# Patient Record
Sex: Male | Born: 1970 | Race: Black or African American | Hispanic: No | Marital: Married | Smoking: Never smoker
Health system: Southern US, Community
[De-identification: ages and names within clinical notes are randomized; demographics above are authoritative.]

## PROBLEM LIST (undated history)

## (undated) DIAGNOSIS — I10 Essential (primary) hypertension: Secondary | ICD-10-CM

## (undated) DIAGNOSIS — T7840XA Allergy, unspecified, initial encounter: Secondary | ICD-10-CM

## (undated) DIAGNOSIS — N189 Chronic kidney disease, unspecified: Secondary | ICD-10-CM

## (undated) DIAGNOSIS — F32A Depression, unspecified: Secondary | ICD-10-CM

## (undated) DIAGNOSIS — F419 Anxiety disorder, unspecified: Secondary | ICD-10-CM

## (undated) DIAGNOSIS — F329 Major depressive disorder, single episode, unspecified: Secondary | ICD-10-CM

## (undated) DIAGNOSIS — N181 Chronic kidney disease, stage 1: Secondary | ICD-10-CM

## (undated) HISTORY — DX: Allergy, unspecified, initial encounter: T78.40XA

## (undated) HISTORY — DX: Chronic kidney disease, unspecified: N18.9

## (undated) HISTORY — DX: Major depressive disorder, single episode, unspecified: F32.9

## (undated) HISTORY — DX: Essential (primary) hypertension: I10

## (undated) HISTORY — DX: Chronic kidney disease, stage 1: N18.1

## (undated) HISTORY — DX: Anxiety disorder, unspecified: F41.9

## (undated) HISTORY — DX: Depression, unspecified: F32.A

---

## 1997-10-28 HISTORY — PX: NASAL SEPTUM SURGERY: SHX37

## 1998-09-04 ENCOUNTER — Other Ambulatory Visit: Admission: RE | Admit: 1998-09-04 | Discharge: 1998-09-04 | Payer: Self-pay | Admitting: Otolaryngology

## 1998-12-31 ENCOUNTER — Encounter: Payer: Self-pay | Admitting: Family Medicine

## 1998-12-31 ENCOUNTER — Ambulatory Visit (HOSPITAL_COMMUNITY): Admission: RE | Admit: 1998-12-31 | Discharge: 1998-12-31 | Payer: Self-pay | Admitting: Family Medicine

## 2009-05-13 ENCOUNTER — Emergency Department (HOSPITAL_COMMUNITY): Admission: EM | Admit: 2009-05-13 | Discharge: 2009-05-13 | Payer: Self-pay | Admitting: Emergency Medicine

## 2011-02-03 LAB — BASIC METABOLIC PANEL
BUN: 12 mg/dL (ref 6–23)
CO2: 28 mEq/L (ref 19–32)
Calcium: 9.7 mg/dL (ref 8.4–10.5)
Chloride: 102 mEq/L (ref 96–112)
Creatinine, Ser: 1.41 mg/dL (ref 0.4–1.5)
GFR calc Af Amer: >60 mL/min (ref >60)
GFR calc non Af Amer: 56 mL/min — ABNORMAL LOW (ref >60)
Glucose, Bld: 91 mg/dL (ref 70–99)
Potassium: 4.2 mEq/L (ref 3.5–5.1)
Sodium: 137 mEq/L (ref 135–145)

## 2011-02-03 LAB — CBC
HCT: 47.2 % (ref 39.0–52.0)
Hemoglobin: 16 g/dL (ref 13.0–17.0)
MCHC: 33.9 g/dL (ref 30.0–36.0)
MCV: 86.7 fL (ref 78.0–100.0)
Platelets: 394 10*3/uL (ref 150–400)
RBC: 5.44 MIL/uL (ref 4.22–5.81)
RDW: 12.2 % (ref 11.5–15.5)
WBC: 5.4 10*3/uL (ref 4.0–10.5)

## 2011-02-03 LAB — DIFFERENTIAL
Basophils Absolute: 0 10*3/uL (ref 0.0–0.1)
Basophils Relative: 1 % (ref 0–1)
Eosinophils Absolute: 0.1 10*3/uL (ref 0.0–0.7)
Eosinophils Relative: 1 % (ref 0–5)
Lymphocytes Relative: 35 % (ref 12–46)
Lymphs Abs: 1.9 10*3/uL (ref 0.7–4.0)
Monocytes Absolute: 0.4 10*3/uL (ref 0.1–1.0)
Monocytes Relative: 8 % (ref 3–12)
Neutro Abs: 3 10*3/uL (ref 1.7–7.7)
Neutrophils Relative %: 56 % (ref 43–77)

## 2011-02-03 LAB — HEMOCCULT GUIAC POC 1CARD (OFFICE): Fecal Occult Bld: NEGATIVE

## 2013-09-30 ENCOUNTER — Encounter: Payer: Self-pay | Admitting: Physician Assistant

## 2013-09-30 ENCOUNTER — Ambulatory Visit (INDEPENDENT_AMBULATORY_CARE_PROVIDER_SITE_OTHER): Payer: BC Managed Care – PPO | Admitting: Physician Assistant

## 2013-09-30 VITALS — BP 142/86 | HR 100 | Temp 98.8°F | Resp 20 | Ht 66.5 in | Wt 194.0 lb

## 2013-09-30 DIAGNOSIS — J988 Other specified respiratory disorders: Secondary | ICD-10-CM

## 2013-09-30 DIAGNOSIS — A499 Bacterial infection, unspecified: Secondary | ICD-10-CM

## 2013-09-30 MED ORDER — AZITHROMYCIN 250 MG PO TABS: ORAL_TABLET | ORAL | Status: DC

## 2013-09-30 NOTE — Progress Notes (Signed)
    Patient ID: SHANARD TRETO MRN: [id number], DOB: 11-29-1970, 42 y.o. Date of Encounter: 09/30/2013, 10:54 AM    Chief Complaint:  Chief Complaint  Patient presents with  . c/o fever up/down, bad cough/congestion, Ha's     HPI: 42 y.o. year old AA male here secondary to above symptoms. Since this started 5 days ago. At that time he felt tired and also developed a cough. Since then he has had worsening cough. He also feels a lot of drainage and phlegm in his throat. He is getting nothing from his nose. No significant sore throat and no ear ache. He is having some fever especially at night up to around 101.     Home Meds: See attached medication section for any medications that were entered at today's visit. The computer does not put those onto this list.The following list is a list of meds entered prior to today's visit.   No current outpatient prescriptions on file prior to visit.   No current facility-administered medications on file prior to visit.    Allergies: No Known Allergies    Review of Systems: See HPI for pertinent ROS. All other ROS negative.    Physical Exam: Blood pressure 142/86, pulse 100, temperature 98.8 F (37.1 C), temperature source Oral, resp. rate 20, height 5' 6.5" (1.689 m), weight 194 lb (87.998 kg)., Body mass index is 30.85 kg/(m^2). General:  WNWD AAM. Appears in no acute distress. HEENT: Normocephalic, atraumatic, eyes without discharge, sclera non-icteric, nares are without discharge. Bilateral auditory canals clear, TM's are without perforation, pearly grey and translucent with reflective cone of light bilaterally. Oral cavityt, posterior pharynx without exudate, erythema, peritonsillar abscess.  Neck: Supple. No thyromegaly. No lymphadenopathy. Lungs: Clear bilaterally to auscultation without wheezes, rales, or rhonchi. Breathing is unlabored. Heart: Regular rhythm. No murmurs, rubs, or gallops. Msk:  Strength and tone normal for  age. Extremities/Skin: Warm and dry. No clubbing or cyanosis. No edema. No rashes or suspicious lesions. Neuro: Alert and oriented X 3. Moves all extremities spontaneously. Gait is normal. CNII-XII grossly in tact. Psych:  Responds to questions appropriately with a normal affect.     ASSESSMENT AND PLAN:  42 y.o. year old male with  1. Bacterial respiratory infection - azithromycin (ZITHROMAX) 250 MG tablet; Day 1: Take 2 daily.  Days 2-5: Take 1 daily  Dispense: 6 tablet; Refill: 0  use Mucinex DM as expectorant. Patient reports that he has been out of work all week. He is not allowed to go to work with a fever. Give note for out of work dated 09/27/2013 through 10/01/2013. F/U if this not resolve with completion of antibiotics. Signed, [address] [place], [place], [facility] 09/30/2013 10:54 AM

## 2013-10-03 ENCOUNTER — Telehealth: Payer: Self-pay | Admitting: Family Medicine

## 2013-10-03 NOTE — Telephone Encounter (Signed)
Pt wife called cough and congestion are worsening has a ratteling in his chest and feels very fatigued, Fever has resolved, he is taking zpak and mucinex Advised to come in for recheck , wife can bring him around 10:30 am

## 2013-10-04 ENCOUNTER — Encounter: Payer: Self-pay | Admitting: Family Medicine

## 2013-10-04 ENCOUNTER — Ambulatory Visit (INDEPENDENT_AMBULATORY_CARE_PROVIDER_SITE_OTHER): Payer: BC Managed Care – PPO | Admitting: Family Medicine

## 2013-10-04 ENCOUNTER — Ambulatory Visit
Admission: RE | Admit: 2013-10-04 | Discharge: 2013-10-04 | Disposition: A | Discharge status: Home / Self Care | Payer: BC Managed Care – PPO | Source: Ambulatory Visit | Attending: Family Medicine | Admitting: Family Medicine

## 2013-10-04 VITALS — BP 120/80 | HR 98 | Temp 98.3°F | Resp 20 | Ht 67.0 in | Wt 195.0 lb

## 2013-10-04 DIAGNOSIS — J988 Other specified respiratory disorders: Secondary | ICD-10-CM

## 2013-10-04 DIAGNOSIS — N289 Disorder of kidney and ureter, unspecified: Secondary | ICD-10-CM

## 2013-10-04 DIAGNOSIS — J209 Acute bronchitis, unspecified: Secondary | ICD-10-CM

## 2013-10-04 LAB — CBC WITH DIFFERENTIAL/PLATELET
HCT: 45.1 % (ref 39.0–52.0)
Lymphocytes Relative: 27 % (ref 12–46)
Lymphs Abs: 2.2 10*3/uL (ref 0.7–4.0)
MCHC: 33.3 g/dL (ref 30.0–36.0)
MCV: 87.3 fL (ref 78.0–100.0)
Monocytes Relative: 3 % (ref 3–12)
Neutro Abs: 5.6 10*3/uL (ref 1.7–7.7)
Platelets: 535 10*3/uL — ABNORMAL HIGH (ref 150–400)
RBC: 5.17 MIL/uL (ref 4.22–5.81)
RDW: 11.3 % — ABNORMAL LOW (ref 11.5–15.5)
WBC: 8 10*3/uL (ref 4.0–10.5)

## 2013-10-04 LAB — BASIC METABOLIC PANEL
BUN: 15 mg/dL (ref 6–23)
CO2: 26 mEq/L (ref 19–32)
Calcium: 10.5 mg/dL (ref 8.4–10.5)
Chloride: 102 mEq/L (ref 96–112)
Creat: 1.4 mg/dL — ABNORMAL HIGH (ref 0.50–1.35)

## 2013-10-04 MED ORDER — ALBUTEROL SULFATE HFA 108 (90 BASE) MCG/ACT IN AERS: 2.0000 | INHALATION_SPRAY | Freq: Four times a day (QID) | RESPIRATORY_TRACT | Status: DC | PRN

## 2013-10-04 MED ORDER — PREDNISONE 20 MG PO TABS: ORAL_TABLET | ORAL | Status: DC

## 2013-10-04 NOTE — Patient Instructions (Signed)
We will call with results of xray and bloodwork I will call with medication changes  F/U as needed

## 2013-10-04 NOTE — Assessment & Plan Note (Signed)
Chest x-ray is normal. His white blood cell count is also normal. Will have him complete the azithromycin I will give him a short course of prednisone and albuterol inhaler

## 2013-10-04 NOTE — Progress Notes (Signed)
   Subjective:    Patient ID: Dustin Solis, male    DOB: 07-12-1971, 42 y.o.   MRN: [id number]  HPI  Patient here to followup respiratory infection. He was seen on December 4 he was started on azithromycin and Mucinex DM. He states that the cough persists with production as well as some rattling in his chest. He has severe coughing attacks. He continues to run a low-grade fever. He denies any shortness of breath or chest pain. He did have 2 episodes of diarrhea last night. Denies any nausea vomiting.   Review of Systems  GEN- denies fatigue,+ fever, weight loss,weakness, recent illness, + chills HEENT- denies eye drainage, change in vision, nasal discharge, CVS- denies chest pain, palpitations RESP- denies SOB+, cough, wheeze ABD- denies N/V,+ change in stools, abd pain Neuro- denies headache, dizziness, syncope, seizure activity      Objective:   Physical Exam GEN- NAD, alert and oriented x3 HEENT- PERRL, EOMI, non injected sclera, pink conjunctiva, MMM, oropharynx no injection, TM clear bilat no effusion, no  maxillary sinus tenderness,nares clear Neck- Supple, no LAD CVS- RRR, no murmur RESP-course BS, few scattered wheeze, normal WOB, oxygen sat 98% EXT- No edema Pulses- Radial 2+         Assessment & Plan:

## 2013-10-04 NOTE — Assessment & Plan Note (Signed)
Mild renal insufficiency seen. Make sure that he increases his hydration

## 2013-10-05 ENCOUNTER — Telehealth: Payer: Self-pay | Admitting: Family Medicine

## 2013-10-05 NOTE — Telephone Encounter (Signed)
Pt and his wife are wanting to know if the xray results are back Call back number is [phone]

## 2013-10-05 NOTE — Telephone Encounter (Signed)
Pt aware of results 

## 2013-10-11 ENCOUNTER — Telehealth: Payer: Self-pay | Admitting: Family Medicine

## 2013-10-11 ENCOUNTER — Telehealth: Payer: Self-pay | Admitting: *Deleted

## 2013-10-11 NOTE — Telephone Encounter (Signed)
Note faxed over to attention of Altha Harm

## 2013-10-11 NOTE — Telephone Encounter (Signed)
Work note done and faxed to pt per his request

## 2013-10-11 NOTE — Telephone Encounter (Signed)
Pt is calling because he is needing a note for last week for work Call back number is [phone]

## 2013-10-12 NOTE — Telephone Encounter (Signed)
Pt wife has called back and they did not receive the fax yesterday because there home phone an fax numbers are the same and their son kept picking the phone up when the fax was going through. Can you please re fax

## 2013-10-13 NOTE — Telephone Encounter (Signed)
Pt wife has called back asking to fax the note back over from the 8th-12th and if they need to they will come by an pick it up the home and the fax number are the same [phone]

## 2013-10-13 NOTE — Telephone Encounter (Signed)
Refax pt request today

## 2013-12-12 ENCOUNTER — Other Ambulatory Visit: Payer: Self-pay | Admitting: Physician Assistant

## 2013-12-13 NOTE — Telephone Encounter (Signed)
Medication refilled per protocol. 

## 2014-02-15 ENCOUNTER — Ambulatory Visit (INDEPENDENT_AMBULATORY_CARE_PROVIDER_SITE_OTHER): Payer: BC Managed Care – PPO | Admitting: Family Medicine

## 2014-02-15 ENCOUNTER — Encounter: Payer: Self-pay | Admitting: Family Medicine

## 2014-02-15 VITALS — BP 132/84 | HR 84 | Temp 97.8°F | Resp 18 | Ht 67.0 in | Wt 199.0 lb

## 2014-02-15 DIAGNOSIS — R110 Nausea: Secondary | ICD-10-CM

## 2014-02-15 DIAGNOSIS — N179 Acute kidney failure, unspecified: Secondary | ICD-10-CM

## 2014-02-15 DIAGNOSIS — J019 Acute sinusitis, unspecified: Secondary | ICD-10-CM

## 2014-02-15 LAB — CBC WITH DIFFERENTIAL/PLATELET
Basophils Absolute: 0 10*3/uL (ref 0.0–0.1)
Basophils Relative: 1 % (ref 0–1)
EOS ABS: 0.1 10*3/uL (ref 0.0–0.7)
Eosinophils Relative: 2 % (ref 0–5)
HCT: 42.3 % (ref 39.0–52.0)
Hemoglobin: 14.6 g/dL (ref 13.0–17.0)
Lymphocytes Relative: 46 % (ref 12–46)
Lymphs Abs: 2 10*3/uL (ref 0.7–4.0)
MCH: 28.5 pg (ref 26.0–34.0)
MCHC: 34.5 g/dL (ref 30.0–36.0)
MCV: 82.6 fL (ref 78.0–100.0)
MONOS PCT: 8 % (ref 3–12)
Monocytes Absolute: 0.4 10*3/uL (ref 0.1–1.0)
NEUTROS PCT: 43 % (ref 43–77)
Neutro Abs: 1.9 10*3/uL (ref 1.7–7.7)
PLATELETS: 436 10*3/uL — AB (ref 150–400)
RBC: 5.12 MIL/uL (ref 4.22–5.81)
RDW: 13.2 % (ref 11.5–15.5)
WBC: 4.4 10*3/uL (ref 4.0–10.5)

## 2014-02-15 LAB — COMPLETE METABOLIC PANEL WITH GFR
ALT: 62 U/L — ABNORMAL HIGH (ref 0–53)
AST: 46 U/L — ABNORMAL HIGH (ref 0–37)
Albumin: 4.4 g/dL (ref 3.5–5.2)
Alkaline Phosphatase: 134 U/L — ABNORMAL HIGH (ref 39–117)
BILIRUBIN TOTAL: 0.4 mg/dL (ref 0.2–1.2)
BUN: 12 mg/dL (ref 6–23)
CO2: 27 meq/L (ref 19–32)
Calcium: 9.6 mg/dL (ref 8.4–10.5)
Chloride: 103 mEq/L (ref 96–112)
Creat: 1.23 mg/dL (ref 0.50–1.35)
GFR, EST AFRICAN AMERICAN: 82 mL/min
GFR, EST NON AFRICAN AMERICAN: 71 mL/min
GLUCOSE: 96 mg/dL (ref 70–99)
Potassium: 4.4 mEq/L (ref 3.5–5.3)
Sodium: 139 mEq/L (ref 135–145)
Total Protein: 7.5 g/dL (ref 6.0–8.3)

## 2014-02-15 MED ORDER — PROMETHAZINE HCL 25 MG PO TABS: 25.0000 mg | ORAL_TABLET | Freq: Three times a day (TID) | ORAL | Status: DC | PRN

## 2014-02-15 MED ORDER — AMOXICILLIN 500 MG PO CAPS: 500.0000 mg | ORAL_CAPSULE | Freq: Three times a day (TID) | ORAL | Status: DC

## 2014-02-15 NOTE — Patient Instructions (Signed)
Work note for 17-22nd, he can return on 02/17/14 No restrictions We will call with lab results Start antibiotics Continue allergy medications Phenergan given Mucinex F/U as needed

## 2014-02-15 NOTE — Progress Notes (Signed)
Patient ID: Dustin Solis, male   DOB: October 19, 1971, 43 y.o.   MRN: [id number]   Subjective:    Patient ID: Dustin Solis, male    DOB: 12/26/1970, 43 y.o.   MRN: [id number]  Patient presents for Illness   patient here with some anxiety symptoms. He has known seasonal allergies. Over the past week he has had worsening drainage and congestion I both sides. He's also had some postnasal drip and swollen lymph nodes. He also notes that all the drains is made him nauseous and he noted that his stool has become a bright green color. He's not had any diarrhea no vomiting no blood in stool. He just feels well sick with stomach. He did miss a couple days a work. He's not had any fever. He hasn't taken his regular scheduled allergy medicines Flonase and Claritin also did a couple days of Sudafed with some improvement.  Note his previous labs noted some acute renal failure this has not been rechecked his creatinine was at 1.4 back in December  Review Of Systems:  GEN- denies fatigue, fever, weight loss,weakness, recent illness HEENT- denies eye drainage, change in vision,+ nasal discharge, CVS- denies chest pain, palpitations RESP- denies SOB, cough, wheeze ABD- + N/ no V, change in stools, abd pain Neuro- denies headache, dizziness, syncope, seizure activity       Objective:    BP 132/84  Pulse 84  Temp(Src) 97.8 F (36.6 C) (Oral)  Resp 18  Ht 5\' 7"  (1.702 m)  Wt 199 lb (90.266 kg)  BMI 31.16 kg/m2 GEN- NAD, alert and oriented x3 HEENT- PERRL, EOMI, non injected sclera, pink conjunctiva, MMM, oropharynx mild injection, TM clear bilat no effusion,  + mild maxillary sinus tenderness, inflammed turbinates,  Nasal drainage  Neck- Supple, +shotty  LAD CVS- RRR, no murmur RESP-CTAB ABD-NABS,soft,NT,ND EXT- No edema Pulses- Radial 2+         Assessment & Plan:      Problem List Items Addressed This Visit   None    Visit Diagnoses   Acute sinusitis    -  Primary    Based on  duration ,worse symptoms, treat antibiotics, mucinex    Relevant Medications       pseudoephedrine (SUDAFED) 30 MG tablet       amoxicillin (AMOXIL) capsule       promethazine (PHENERGAN)  tablet    Nausea alone        Pherngan due to post nasal drip    Acute renal failure        recheck creatine and GFR, he had acute illness at time    Relevant Orders       CBC with Differential       COMPLETE METABOLIC PANEL WITH GFR       Note: This dictation was prepared with Dragon dictation along with smaller phrase technology. Any transcriptional errors that result from this process are unintentional.

## 2014-02-17 ENCOUNTER — Other Ambulatory Visit: Payer: Self-pay | Admitting: *Deleted

## 2014-02-17 DIAGNOSIS — R748 Abnormal levels of other serum enzymes: Secondary | ICD-10-CM

## 2014-02-23 ENCOUNTER — Other Ambulatory Visit: Payer: BC Managed Care – PPO

## 2014-02-23 DIAGNOSIS — R748 Abnormal levels of other serum enzymes: Secondary | ICD-10-CM

## 2014-02-24 ENCOUNTER — Other Ambulatory Visit: Payer: BC Managed Care – PPO

## 2014-02-24 LAB — HEPATIC FUNCTION PANEL
ALBUMIN: 4.4 g/dL (ref 3.5–5.2)
ALK PHOS: 133 U/L — AB (ref 39–117)
ALT: 52 U/L (ref 0–53)
AST: 37 U/L (ref 0–37)
BILIRUBIN DIRECT: 0.1 mg/dL (ref 0.0–0.3)
BILIRUBIN TOTAL: 0.6 mg/dL (ref 0.2–1.2)
Indirect Bilirubin: 0.5 mg/dL (ref 0.2–1.2)
Total Protein: 7.5 g/dL (ref 6.0–8.3)

## 2014-02-24 LAB — LIPID PANEL
Cholesterol: 172 mg/dL (ref 0–200)
HDL: 45 mg/dL (ref >39)
LDL Cholesterol: 102 mg/dL — ABNORMAL HIGH (ref 0–99)
TRIGLYCERIDES: 123 mg/dL (ref <150)
Total CHOL/HDL Ratio: 3.8 Ratio
VLDL: 25 mg/dL (ref 0–40)

## 2014-06-17 ENCOUNTER — Other Ambulatory Visit: Payer: BC Managed Care – PPO

## 2014-06-17 DIAGNOSIS — N289 Disorder of kidney and ureter, unspecified: Secondary | ICD-10-CM

## 2014-06-17 DIAGNOSIS — Z0000 Encounter for general adult medical examination without abnormal findings: Secondary | ICD-10-CM

## 2014-06-17 DIAGNOSIS — E785 Hyperlipidemia, unspecified: Secondary | ICD-10-CM

## 2014-06-17 LAB — COMPLETE METABOLIC PANEL WITH GFR
ALK PHOS: 137 U/L — AB (ref 39–117)
ALT: 42 U/L (ref 0–53)
AST: 34 U/L (ref 0–37)
Albumin: 4.5 g/dL (ref 3.5–5.2)
BILIRUBIN TOTAL: 0.7 mg/dL (ref 0.2–1.2)
BUN: 13 mg/dL (ref 6–23)
CO2: 25 mEq/L (ref 19–32)
Calcium: 9.9 mg/dL (ref 8.4–10.5)
Chloride: 100 mEq/L (ref 96–112)
Creat: 1.43 mg/dL — ABNORMAL HIGH (ref 0.50–1.35)
GFR, EST NON AFRICAN AMERICAN: 59 mL/min — AB
GFR, Est African American: 68 mL/min
Glucose, Bld: 94 mg/dL (ref 70–99)
Potassium: 4.4 mEq/L (ref 3.5–5.3)
Sodium: 135 mEq/L (ref 135–145)
TOTAL PROTEIN: 7.7 g/dL (ref 6.0–8.3)

## 2014-06-17 LAB — CBC WITH DIFFERENTIAL/PLATELET
Basophils Absolute: 0 10*3/uL (ref 0.0–0.1)
Basophils Relative: 1 % (ref 0–1)
Eosinophils Absolute: 0.1 10*3/uL (ref 0.0–0.7)
Eosinophils Relative: 2 % (ref 0–5)
HEMATOCRIT: 42.7 % (ref 39.0–52.0)
HEMOGLOBIN: 14.9 g/dL (ref 13.0–17.0)
LYMPHS ABS: 1.7 10*3/uL (ref 0.7–4.0)
Lymphocytes Relative: 44 % (ref 12–46)
MCH: 28.9 pg (ref 26.0–34.0)
MCHC: 34.9 g/dL (ref 30.0–36.0)
MCV: 82.8 fL (ref 78.0–100.0)
MONO ABS: 0.3 10*3/uL (ref 0.1–1.0)
Monocytes Relative: 8 % (ref 3–12)
Neutro Abs: 1.7 10*3/uL (ref 1.7–7.7)
Neutrophils Relative %: 45 % (ref 43–77)
Platelets: 398 10*3/uL (ref 150–400)
RBC: 5.16 MIL/uL (ref 4.22–5.81)
RDW: 13 % (ref 11.5–15.5)
WBC: 3.8 10*3/uL — ABNORMAL LOW (ref 4.0–10.5)

## 2014-06-17 LAB — LIPID PANEL
CHOL/HDL RATIO: 3.9 ratio
Cholesterol: 174 mg/dL (ref 0–200)
HDL: 45 mg/dL (ref >39)
LDL Cholesterol: 90 mg/dL (ref 0–99)
Triglycerides: 195 mg/dL — ABNORMAL HIGH (ref <150)
VLDL: 39 mg/dL (ref 0–40)

## 2014-06-20 ENCOUNTER — Encounter: Payer: BC Managed Care – PPO | Admitting: Family Medicine

## 2014-07-21 ENCOUNTER — Encounter: Payer: BC Managed Care – PPO | Admitting: Family Medicine

## 2014-08-02 ENCOUNTER — Ambulatory Visit: Payer: BC Managed Care – PPO | Admitting: Family Medicine

## 2014-08-27 ENCOUNTER — Other Ambulatory Visit: Payer: Self-pay | Admitting: Physician Assistant

## 2014-08-29 NOTE — Telephone Encounter (Signed)
Medication refilled per protocol. 

## 2014-12-02 ENCOUNTER — Telehealth: Payer: Self-pay | Admitting: Family Medicine

## 2014-12-02 MED ORDER — FLUTICASONE PROPIONATE 50 MCG/ACT NA SUSP: 2.0000 | Freq: Every day | NASAL | Status: DC

## 2014-12-02 NOTE — Telephone Encounter (Signed)
[phone] [facility]  Pt is needing a refill on Nasal Steroid Spray

## 2014-12-02 NOTE — Telephone Encounter (Signed)
Call placed to patient to inquire. LMTRC. 

## 2014-12-02 NOTE — Telephone Encounter (Signed)
[facility]

## 2014-12-05 ENCOUNTER — Ambulatory Visit (INDEPENDENT_AMBULATORY_CARE_PROVIDER_SITE_OTHER): Payer: BC Managed Care – PPO | Admitting: Physician Assistant

## 2014-12-05 ENCOUNTER — Encounter: Payer: Self-pay | Admitting: Family Medicine

## 2014-12-05 ENCOUNTER — Encounter: Payer: Self-pay | Admitting: Physician Assistant

## 2014-12-05 VITALS — BP 114/74 | HR 80 | Temp 98.3°F | Resp 18 | Wt 186.0 lb

## 2014-12-05 DIAGNOSIS — H8309 Labyrinthitis, unspecified ear: Secondary | ICD-10-CM

## 2014-12-05 MED ORDER — MECLIZINE HCL 25 MG PO TABS: 25.0000 mg | ORAL_TABLET | Freq: Three times a day (TID) | ORAL | Status: DC | PRN

## 2014-12-05 NOTE — Progress Notes (Signed)
Patient ID: MORT SMELSER MRN: [id number], DOB: 1970/11/18, 44 y.o. Date of Encounter: 12/05/2014, 10:57 AM    Chief Complaint:  Chief Complaint  Patient presents with  . sick x 1 week    sinus infection  OTC sudafed not helping     HPI: 44 y.o. year old [place] male says that he has had some head and nasal congestion over the past week. Says that the amount of drainage has decreased a lot and he thinks that it is resolving. Says that he never had any chest congestion. Has had no on significant fevers or chills. Says that 2 days ago he suddenly developed symptoms of feeling congested in his head that were causing significant nausea.  Says that anytime he first sits up or stands up-- it makes him feel very nauseous like he's going to throw up.  No true vertigo and no spinning sensation. Says that this has happened to him in the past after viral illnesses. 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     Home Meds:   Outpatient Prescriptions Prior to Visit  Medication Sig Dispense Refill  . acetaminophen (TYLENOL) 325 MG tablet Take 650 mg by mouth every 6 (six) hours as needed.    Marland Kitchen amitriptyline (ELAVIL) 10 MG tablet Take 10 mg by mouth at bedtime as needed for sleep.    . fluticasone (FLONASE) 50 MCG/ACT nasal spray Place 2 sprays into both nostrils daily. 16 g 3  . Multiple Vitamin (MULTIVITAMIN) tablet Take 1 tablet by mouth daily.    . promethazine (PHENERGAN) 25 MG tablet Take 1 tablet (25 mg total) by mouth every 8 (eight) hours as needed for nausea or vomiting. 20 tablet 0  . pseudoephedrine (SUDAFED) 30 MG tablet Take 30 mg by mouth every 4 (four) hours as needed for congestion.    Marland Kitchen loratadine (CLARITIN) 10 MG tablet Take 10 mg by mouth daily.    Marland Kitchen amoxicillin (AMOXIL) 500 MG capsule Take 1 capsule (500 mg total) by mouth 3 (three) times daily. 30 capsule 0   No facility-administered medications prior to visit.    Allergies:  No Known Allergies    Review of Systems: See HPI for pertinent ROS. All other ROS negative.    Physical Exam: Blood pressure 114/74, pulse 80, temperature 98.3 F (36.8 C), temperature source Oral, resp. rate 18, weight 186 lb (84.369 kg)., Body mass index is 29.12 kg/(m^2). General:  WNWD AAM. Appears in no acute distress. HEENT: Normocephalic, atraumatic, eyes without discharge, sclera non-icteric, nares are without discharge. Bilateral auditory canals clear, TM's are without perforation, pearly grey and translucent with reflective cone of light bilaterally. Oral cavity moist, posterior pharynx without exudate, erythema, peritonsillar abscess. No tenderness with percussion of frontal and maxillary sinuses bilaterally.  Neck: Supple. No thyromegaly. No lymphadenopathy. Lungs: Clear bilaterally to auscultation without wheezes, rales, or rhonchi. Breathing is unlabored. Heart: Regular rhythm. No murmurs, rubs, or gallops. DixHallPikeManeuver-- This actual maneuver causes no vertigo and no nausea. However, when he sits back up after lying down, this causes the nauseous symptoms. Says that this reproduces the symptoms he has been having. Abdomen: Soft, non-tender, non-distended with normoactive bowel sounds. No hepatomegaly. No rebound/guarding. No obvious abdominal masses. Msk:  Strength and tone normal for age44. Extremities/Skin: Warm and dry. Neuro: Alert and oriented X 3. Moves all extremities spontaneously. Gait is normal. CNII-XII grossly in tact. Romberg is normal. Psych:  Responds  to questions appropriately with a normal affect.     ASSESSMENT AND PLAN:  44 y.o. year old male with  1. Viral labyrinthitis, unspecified laterality He is to continue his current decongestants. He is to take meclizine as needed over the next couple of days. Hopefully symptoms will resolve soon. Give him a note for out of work for today and tomorrow. Follow-up with [place] if symptoms are not resolving at that  time. - meclizine (ANTIVERT) 25 MG tablet; Take 1 tablet (25 mg total) by mouth 3 (three) times daily as needed for dizziness.  Dispense: 30 tablet; Refill: 0   Signed, [address] [place], [place], [facility] 12/05/2014 10:57 AM

## 2014-12-07 ENCOUNTER — Encounter: Payer: Self-pay | Admitting: Family Medicine

## 2015-01-13 ENCOUNTER — Encounter: Payer: Self-pay | Admitting: Family Medicine

## 2015-01-13 ENCOUNTER — Ambulatory Visit (INDEPENDENT_AMBULATORY_CARE_PROVIDER_SITE_OTHER): Payer: BC Managed Care – PPO | Admitting: Family Medicine

## 2015-01-13 ENCOUNTER — Telehealth: Payer: Self-pay | Admitting: Family Medicine

## 2015-01-13 VITALS — BP 134/70 | HR 76 | Temp 97.8°F | Resp 16 | Ht 67.0 in | Wt 185.0 lb

## 2015-01-13 DIAGNOSIS — S39012A Strain of muscle, fascia and tendon of lower back, initial encounter: Secondary | ICD-10-CM

## 2015-01-13 DIAGNOSIS — S161XXA Strain of muscle, fascia and tendon at neck level, initial encounter: Secondary | ICD-10-CM

## 2015-01-13 MED ORDER — MELOXICAM 7.5 MG PO TABS: 7.5000 mg | ORAL_TABLET | Freq: Every day | ORAL | Status: DC

## 2015-01-13 MED ORDER — HYDROCODONE-ACETAMINOPHEN 5-325 MG PO TABS: 1.0000 | ORAL_TABLET | Freq: Four times a day (QID) | ORAL | Status: DC | PRN

## 2015-01-13 MED ORDER — CYCLOBENZAPRINE HCL 10 MG PO TABS: 10.0000 mg | ORAL_TABLET | Freq: Three times a day (TID) | ORAL | Status: DC | PRN

## 2015-01-13 NOTE — Progress Notes (Signed)
Patient ID: Dustin Solis, male   DOB: 1970-12-15, 44 y.o.   MRN: [id number]   Subjective:    Patient ID: Dustin Solis, male    DOB: 01-23-1971, 44 y.o.   MRN: [id number]  Patient presents for Back Pain  back and neck pain. The pain isn't severe for the past 24 hours. He was actually working out about a week ago he was doing some presses over head when he started to have strain and the left side of his shoulder and his neck he actually dropped the weights.He did go home and use heat and ice and the pain improved however he returned to  his workouts for the past few days. He was sitting in his chair last night with his neck sitting downward when he began to have a severe pain the cane from his neck that went to the mid back he then had pain in his lower back when he would try to bend over that would come down towards his buttocks and around towards the abdomen. He denied any chest pain or shortness of breath. They did call the hotline last night around midnight he use heat and he took a hydrocodone which she had at home and this did relieve the pain and he was better this morning until he's tried to get up and move around mostly with bending. He denies any change in bowel or bladder    Review Of Systems:  GEN- denies fatigue, fever, weight loss,weakness, recent illness HEENT- denies eye drainage, change in vision, nasal discharge, CVS- denies chest pain, palpitations RESP- denies SOB, cough, wheeze ABD- denies N/V, change in stools, abd pain GU- denies dysuria, hematuria, dribbling, incontinence MSK- + joint pain, +muscle aches, injury Neuro- denies headache, dizziness, syncope, seizure activity       Objective:    BP 134/70 mmHg  Pulse 76  Temp(Src) 97.8 F (36.6 C) (Oral)  Resp 16  Ht 5\' 7"  (1.702 m)  Wt 185 lb (83.915 kg)  BMI 28.97 kg/m2 GEN- NAD, alert and oriented x3 Neck- + stiff ROM, pain with flexion of neck, +spasm  CVS- RRR, no murmur RESP-CTAB MSK- TTP lumbar  spine, +spasm paraspinals, TTP over paraspinals L > R, neg SLR, FROM Hips, Decreased flexion/extension of spine,  Neuro- CNII-XII intact, no deficits, normal tone, DTR symmetric, strength 5/5 bilat LE, Upper ext EXT- No edema Pulses- Radial, 2+        Assessment & Plan:      Problem List Items Addressed This Visit    None    Visit Diagnoses    Cervical strain, acute, initial encounter    -  Primary    Treat with heat, muscle relaxers, mobic, norco. No working out or weights for next 2 weeks. If not better, by next week  C spine/Lspine xray    Lumbar strain, initial encounter           Note: This dictation was prepared with Dragon dictation along with smaller phrase technology. Any transcriptional errors that result from this process are unintentional.

## 2015-01-13 NOTE — Telephone Encounter (Signed)
Call placed to patient.   Reports that medication and heat eased pain while resting, but since he is now up and moving, he is having painwhen he bends.   Patient has appointment today at noon.

## 2015-01-13 NOTE — Patient Instructions (Signed)
Take muscle relaxers as prescribed Take the meloxicam once a day for inflammation Pain medication refilled Call Monday if not improved  F/U as needed

## 2015-01-13 NOTE — Telephone Encounter (Signed)
Patient called the after hours hotline last night he was having severe low back pain with some mild radiation. It been going on for a couple hours. He denies any particular injury. His wife is actually the one on the phone given information. There was no change in bowel bladder no fever no vomiting no GI symptoms. He is not taking any medication to help with the back pain. I advised him to take ibuprofen 600 mg with food his wife then noted that he had some old hydrocodone 5-325mg  at home and finds it was okay to take one of those and we will call in the morning to see if he needed appointment

## 2015-02-13 ENCOUNTER — Encounter: Payer: Self-pay | Admitting: Family Medicine

## 2015-02-22 ENCOUNTER — Ambulatory Visit (INDEPENDENT_AMBULATORY_CARE_PROVIDER_SITE_OTHER): Payer: BC Managed Care – PPO | Admitting: Family Medicine

## 2015-02-22 ENCOUNTER — Encounter: Payer: Self-pay | Admitting: Family Medicine

## 2015-02-22 VITALS — BP 118/76 | HR 76 | Temp 98.1°F | Resp 14 | Ht 67.0 in | Wt 188.0 lb

## 2015-02-22 DIAGNOSIS — S161XXD Strain of muscle, fascia and tendon at neck level, subsequent encounter: Secondary | ICD-10-CM

## 2015-02-22 DIAGNOSIS — S161XXA Strain of muscle, fascia and tendon at neck level, initial encounter: Secondary | ICD-10-CM | POA: Insufficient documentation

## 2015-02-22 NOTE — Progress Notes (Signed)
Patient ID: Dustin Solis, male   DOB: Nov 25, 1970, 44 y.o.   MRN: [id number]   Subjective:    Patient ID: Dustin Solis, male    DOB: [birth year], 44 y.o.   MRN: [id number]  Patient presents for Neck/ Upper Back Pain  patient here with neck and upper back pain. He was actually seen about a month ago after he injured himself lifting weights. He states that his symptoms actually improved and he was doing well until yesterday he will to wash his empiric T-shirt on and had severe pain she down his neck and into the left side of his shoulder. He denies any tingling numbness in the upper extremity. He did start taking the meloxicam, pain medication and muscle relaxer last night and got some improvement from this. 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 when he is trying to turn and look    Review Of Systems:  GEN- denies fatigue, fever, weight loss,weakness, recent illness HEENT- denies eye drainage, change in vision, nasal discharge, CVS- denies chest pain, palpitations RESP- denies SOB, cough, wheeze ABD- denies N/V, change in stools, abd pain GU- denies dysuria, hematuria, dribbling, incontinence MSK- + joint pain, +muscle aches, injury Neuro- denies headache, dizziness, syncope, seizure activity       Objective:    BP 118/76 mmHg  Pulse 76  Temp(Src) 98.1 F (36.7 C) (Oral)  Resp 14  Ht 5\' 7"  (1.702 m)  Wt 188 lb (85.276 kg)  BMI 29.44 kg/m2 GEN- NAD, alert and oriented x3 Neck- + stiff ROM, decreased ROM of c spine, pain with flexion of neck and extension, +spasm ,TTP of muscle MSK- thoracic spine NT, rotator cuff intact, biceps in tact Neuro- CNII-XII intact, no deficits, normal tone, DTR symmetric, strength 5/5 bilat LE, Upper ext EXT- No edema Pulses- Radial, 2+          Assessment & Plan:      Problem List Items Addressed This Visit    Cervical strain, acute - Primary   Relevant Orders   DG Cervical Spine Complete      Note: This dictation was prepared with Dragon dictation along with smaller phrase technology. Any transcriptional errors that result from this process are unintentional.

## 2015-02-22 NOTE — Assessment & Plan Note (Signed)
Recurrent symptoms, though strainge mechanism of injury, contine NSAIDS, muscle relaxer, heat, will also get C spine xrays,unable to drive with decreased ROM neck

## 2015-02-24 ENCOUNTER — Ambulatory Visit
Admission: RE | Admit: 2015-02-24 | Discharge: 2015-02-24 | Disposition: A | Discharge status: Home / Self Care | Payer: BC Managed Care – PPO | Source: Ambulatory Visit | Attending: Family Medicine | Admitting: Family Medicine

## 2015-02-24 DIAGNOSIS — S161XXD Strain of muscle, fascia and tendon at neck level, subsequent encounter: Secondary | ICD-10-CM

## 2015-02-27 ENCOUNTER — Encounter: Payer: Self-pay | Admitting: Family Medicine

## 2015-03-15 ENCOUNTER — Encounter: Payer: Self-pay | Admitting: Family Medicine

## 2015-06-01 ENCOUNTER — Other Ambulatory Visit: Payer: BC Managed Care – PPO

## 2015-06-01 DIAGNOSIS — Z0000 Encounter for general adult medical examination without abnormal findings: Secondary | ICD-10-CM

## 2015-06-01 LAB — LIPID PANEL
CHOLESTEROL: 164 mg/dL (ref 125–200)
HDL: 46 mg/dL (ref >=40)
LDL Cholesterol: 96 mg/dL (ref <130)
TRIGLYCERIDES: 110 mg/dL (ref <150)
Total CHOL/HDL Ratio: 3.6 Ratio (ref <=5.0)
VLDL: 22 mg/dL (ref <30)

## 2015-06-01 LAB — CBC WITH DIFFERENTIAL/PLATELET
BASOS ABS: 0 10*3/uL (ref 0.0–0.1)
Basophils Relative: 0 % (ref 0–1)
EOS ABS: 0 10*3/uL (ref 0.0–0.7)
Eosinophils Relative: 1 % (ref 0–5)
HCT: 44 % (ref 39.0–52.0)
HEMOGLOBIN: 15.2 g/dL (ref 13.0–17.0)
LYMPHS ABS: 1.7 10*3/uL (ref 0.7–4.0)
LYMPHS PCT: 41 % (ref 12–46)
MCH: 29.2 pg (ref 26.0–34.0)
MCHC: 34.5 g/dL (ref 30.0–36.0)
MCV: 84.5 fL (ref 78.0–100.0)
MPV: 9.2 fL (ref 8.6–12.4)
Monocytes Absolute: 0.3 10*3/uL (ref 0.1–1.0)
Monocytes Relative: 8 % (ref 3–12)
NEUTROS PCT: 50 % (ref 43–77)
Neutro Abs: 2.1 10*3/uL (ref 1.7–7.7)
Platelets: 346 10*3/uL (ref 150–400)
RBC: 5.21 MIL/uL (ref 4.22–5.81)
RDW: 12.7 % (ref 11.5–15.5)
WBC: 4.1 10*3/uL (ref 4.0–10.5)

## 2015-06-01 LAB — COMPLETE METABOLIC PANEL WITH GFR
ALT: 33 U/L (ref 9–46)
AST: 42 U/L — AB (ref 10–40)
Albumin: 4.1 g/dL (ref 3.6–5.1)
Alkaline Phosphatase: 83 U/L (ref 40–115)
BUN: 15 mg/dL (ref 7–25)
CHLORIDE: 103 mmol/L (ref 98–110)
CO2: 26 mmol/L (ref 20–31)
Calcium: 9.6 mg/dL (ref 8.6–10.3)
Creat: 1.41 mg/dL — ABNORMAL HIGH (ref 0.60–1.35)
GFR, EST NON AFRICAN AMERICAN: 60 mL/min (ref >=60)
GFR, Est African American: 69 mL/min (ref >=60)
Glucose, Bld: 91 mg/dL (ref 70–99)
Potassium: 4.7 mmol/L (ref 3.5–5.3)
Sodium: 137 mmol/L (ref 135–146)
Total Bilirubin: 0.6 mg/dL (ref 0.2–1.2)
Total Protein: 7 g/dL (ref 6.1–8.1)

## 2015-06-01 LAB — TSH: TSH: 2.998 u[IU]/mL (ref 0.350–4.500)

## 2015-06-02 LAB — PSA: PSA: 0.36 ng/mL (ref <=4.00)

## 2015-06-05 ENCOUNTER — Ambulatory Visit (INDEPENDENT_AMBULATORY_CARE_PROVIDER_SITE_OTHER): Payer: BC Managed Care – PPO | Admitting: Family Medicine

## 2015-06-05 ENCOUNTER — Encounter: Payer: Self-pay | Admitting: Family Medicine

## 2015-06-05 VITALS — BP 110/70 | HR 78 | Temp 98.1°F | Resp 16 | Ht 67.0 in | Wt 186.0 lb

## 2015-06-05 DIAGNOSIS — N181 Chronic kidney disease, stage 1: Secondary | ICD-10-CM | POA: Insufficient documentation

## 2015-06-05 DIAGNOSIS — Z0000 Encounter for general adult medical examination without abnormal findings: Secondary | ICD-10-CM

## 2015-06-05 NOTE — Progress Notes (Signed)
Subjective:    Patient ID: Dustin Solis, male    DOB: [birth year], 44 y.o.   MRN: [id number]  HPI  Patient is here for a CPE.  Last year, was found to have a mildly elevated creatinine.  Most recent lab work is listed below: Lab on 06/01/2015  Component Date Value Ref Range Status  . Cholesterol 06/01/2015 164  125 - 200 mg/dL Final  . Triglycerides 06/01/2015 110  <150 mg/dL Final  . HDL 06/01/2015 46  >=40 mg/dL Final  . Total CHOL/HDL Ratio 06/01/2015 3.6  <=5.0 Ratio Final  . VLDL 06/01/2015 22  <30 mg/dL Final  . LDL Cholesterol 06/01/2015 96  <130 mg/dL Final   Comment:   Total Cholesterol/HDL Ratio:CHD Risk                        Coronary Heart Disease Risk Table                                        Men       Women          1/2 Average Risk              3.4        3.3              Average Risk              5.0        4.4           2X Average Risk              9.6        7.1           3X Average Risk             23.4       11.0 Use the calculated Patient Ratio above and the CHD Risk table  to determine the patient's CHD Risk. ATP III Classification (LDL):       < 100        mg/dL         Optimal      100 - 129     mg/dL         Near or Above Optimal      130 - 159     mg/dL         Borderline High      160 - 189     mg/dL         High       > 190        mg/dL         Very High     . Sodium 06/01/2015 137  135 - 146 mmol/L Final  . Potassium 06/01/2015 4.7  3.5 - 5.3 mmol/L Final  . Chloride 06/01/2015 103  98 - 110 mmol/L Final  . CO2 06/01/2015 26  20 - 31 mmol/L Final  . Glucose, Bld 06/01/2015 91  70 - 99 mg/dL Final  . BUN 06/01/2015 15  7 - 25 mg/dL Final  . Creat 06/01/2015 1.41* 0.60 - 1.35 mg/dL Final  . Total Bilirubin 06/01/2015 0.6  0.2 - 1.2 mg/dL Final  . Alkaline Phosphatase 06/01/2015 83  40 - 115 U/L Final  . AST 06/01/2015 42* 10 - 40 U/L Final  .  ALT 06/01/2015 33  9 - 46 U/L Final  . Total Protein 06/01/2015 7.0  6.1 - 8.1 g/dL Final  .  Albumin 06/01/2015 4.1  3.6 - 5.1 g/dL Final  . Calcium 06/01/2015 9.6  8.6 - 10.3 mg/dL Final  . GFR, Est African American 06/01/2015 69  >=60 mL/min Final  . GFR, Est Non African American 06/01/2015 60  >=60 mL/min Final   Comment:   The estimated GFR is a calculation valid for adults (>=26 years old) that uses the CKD-EPI algorithm to adjust for age and sex. It is   not to be used for children, pregnant women, hospitalized patients,    patients on dialysis, or with rapidly changing kidney function. According to the NKDEP, eGFR >89 is normal, 60-89 shows mild impairment, 30-59 shows moderate impairment, 15-29 shows severe impairment and <15 is ESRD.     . WBC 06/01/2015 4.1  4.0 - 10.5 K/uL Final  . RBC 06/01/2015 5.21  4.22 - 5.81 MIL/uL Final  . Hemoglobin 06/01/2015 15.2  13.0 - 17.0 g/dL Final  . HCT 06/01/2015 44.0  39.0 - 52.0 % Final  . MCV 06/01/2015 84.5  78.0 - 100.0 fL Final  . MCH 06/01/2015 29.2  26.0 - 34.0 pg Final  . MCHC 06/01/2015 34.5  30.0 - 36.0 g/dL Final  . RDW 06/01/2015 12.7  11.5 - 15.5 % Final  . Platelets 06/01/2015 346  150 - 400 K/uL Final  . MPV 06/01/2015 9.2  8.6 - 12.4 fL Final  . Neutrophils Relative % 06/01/2015 50  43 - 77 % Final  . Neutro Abs 06/01/2015 2.1  1.7 - 7.7 K/uL Final  . Lymphocytes Relative 06/01/2015 41  12 - 46 % Final  . Lymphs Abs 06/01/2015 1.7  0.7 - 4.0 K/uL Final  . Monocytes Relative 06/01/2015 8  3 - 12 % Final  . Monocytes Absolute 06/01/2015 0.3  0.1 - 1.0 K/uL Final  . Eosinophils Relative 06/01/2015 1  0 - 5 % Final  . Eosinophils Absolute 06/01/2015 0.0  0.0 - 0.7 K/uL Final  . Basophils Relative 06/01/2015 0  0 - 1 % Final  . Basophils Absolute 06/01/2015 0.0  0.0 - 0.1 K/uL Final  . Smear Review 06/01/2015 Criteria for review not met   Final  . TSH 06/01/2015 2.998  0.350 - 4.500 uIU/mL Final  . PSA 06/01/2015 0.36  <=4.00 ng/mL Final   Comment: Test Methodology: ECLIA PSA (Electrochemiluminescence  Immunoassay)   For PSA values from 2.5-4.0, particularly in younger men <27 years old, the AUA and NCCN suggest testing for % Free PSA (3515) and evaluation of the rate of increase in PSA (PSA velocity).   Footnotes:  (1) ** Please note change in unit of measure and reference range(s). **      Creatinine is stable.  Otherwise, patient has been doing well.  He is exercising everyday.  His weight is similar but he appears much leaner.  He is trying to eat healthy.  He has been avoiding NSAIDs.   Past Medical History  Diagnosis Date  . CKD (chronic kidney disease), stage I    Current Outpatient Prescriptions on File Prior to Visit  Medication Sig Dispense Refill  . amitriptyline (ELAVIL) 10 MG tablet Take 10 mg by mouth at bedtime as needed for sleep.    . cetirizine (ZYRTEC) 10 MG tablet Take 10 mg by mouth daily.    . fluticasone (FLONASE) 50 MCG/ACT nasal spray Place 2 sprays into both  nostrils daily. 16 g 3  . Multiple Vitamin (MULTIVITAMIN) tablet Take 1 tablet by mouth daily.     No current facility-administered medications on file prior to visit.   No Known Allergies History   Social History  . Marital Status: Married    Spouse Name: N/A  . Number of Children: N/A  . Years of Education: N/A   Occupational History  . Not on file.   Social History Main Topics  . Smoking status: Never Smoker   . Smokeless tobacco: Never Used  . Alcohol Use: No  . Drug Use: No  . Sexual Activity: Not on file   Other Topics Concern  . 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 Denies fam history of colon ca, prostate ca, or premature CAD.    Review of Systems  All other systems reviewed and are negative.      Objective:   Physical Exam  Constitutional: He is oriented to person, place, and time. He appears well-developed and well-nourished. No distress.  HENT:  Head: Normocephalic and atraumatic.  Right Ear: External ear normal.  Left Ear:  External ear normal.  Nose: Nose normal.  Mouth/Throat: Oropharynx is clear and moist. No oropharyngeal exudate.  Eyes: Conjunctivae and EOM are normal. Pupils are equal, round, and reactive to light. Right eye exhibits no discharge. Left eye exhibits no discharge. No scleral icterus.  Neck: Normal range of motion. Neck supple. No JVD present. No tracheal deviation present. No thyromegaly present.  Cardiovascular: Normal rate, regular rhythm, normal heart sounds and intact distal pulses.  Exam reveals no gallop and no friction rub.   No murmur heard. Pulmonary/Chest: Effort normal and breath sounds normal. No stridor. No respiratory distress. He has no wheezes. He has no rales. He exhibits no tenderness.  Abdominal: Soft. Bowel sounds are normal. He exhibits no distension and no mass. There is no tenderness. There is no rebound and no guarding.  Genitourinary: Rectum normal, prostate normal and penis normal.  Musculoskeletal: Normal range of motion. He exhibits no edema or tenderness.  Lymphadenopathy:    He has no cervical adenopathy.  Neurological: He is alert and oriented to person, place, and time. He has normal reflexes. He displays normal reflexes. No cranial nerve deficit. He exhibits normal muscle tone. Coordination normal.  Skin: Skin is warm. No rash noted. He is not diaphoretic. No erythema. No pallor.  Psychiatric: He has a normal mood and affect. His behavior is normal. Judgment and thought content normal.  Vitals reviewed.         Assessment & Plan:  Routine general medical examination at a health care facility  Patient's physical exam today is completely normal. His lab work is excellent outside of a mildly elevated creatinine. I recommended weight loss, sodium avoidance, avoidance of NSAIDs, and recommended he drink plenty of fluids. I would monitor his kidney function on a yearly basis. His cholesterol and blood pressure excellent. PSA is normal. Prostate exam today is  normal. The remainder of his physical exam is normal. Follow-up in one year or as needed

## 2015-08-03 ENCOUNTER — Other Ambulatory Visit: Payer: Self-pay | Admitting: Family Medicine

## 2015-08-03 NOTE — Telephone Encounter (Signed)
Refill appropriate and filled per protocol. 

## 2015-10-12 ENCOUNTER — Encounter: Payer: Self-pay | Admitting: Family Medicine

## 2015-10-12 ENCOUNTER — Ambulatory Visit (INDEPENDENT_AMBULATORY_CARE_PROVIDER_SITE_OTHER): Payer: BC Managed Care – PPO | Admitting: Family Medicine

## 2015-10-12 VITALS — BP 130/90 | HR 18 | Temp 98.2°F | Resp 16 | Wt 182.0 lb

## 2015-10-12 DIAGNOSIS — F329 Major depressive disorder, single episode, unspecified: Secondary | ICD-10-CM

## 2015-10-12 DIAGNOSIS — F411 Generalized anxiety disorder: Secondary | ICD-10-CM

## 2015-10-12 DIAGNOSIS — F32A Depression, unspecified: Secondary | ICD-10-CM

## 2015-10-12 MED ORDER — ESCITALOPRAM OXALATE 10 MG PO TABS: 10.0000 mg | ORAL_TABLET | Freq: Every day | ORAL | Status: DC

## 2015-10-12 MED ORDER — ALPRAZOLAM 0.5 MG PO TABS: 0.5000 mg | ORAL_TABLET | Freq: Three times a day (TID) | ORAL | Status: DC | PRN

## 2015-10-12 NOTE — Progress Notes (Signed)
   Subjective:    Patient ID: Dustin Solis, male    DOB: [birth year], 44 y.o.   MRN: [id number]  HPI Reports depression for 6 months.  REports daily anxiety and occasional panic attacks.  Has trouble sleeping due to his mind ruminating over stress in his life and because of anxiety.  He has no energy.  Hard to even motivate himself to get out of bed in the morning.  Denies SI.  Does have poor appetite and daily nausea triggered by anxiety.   Past Medical History  Diagnosis Date  . CKD (chronic kidney disease), stage I    Past Surgical History  Procedure Laterality Date  . Nasal septum surgery  1999   Current Outpatient Prescriptions on File Prior to Visit  Medication Sig Dispense Refill  . amitriptyline (ELAVIL) 10 MG tablet Take 10 mg by mouth at bedtime as needed for sleep.    . cetirizine (ZYRTEC) 10 MG tablet Take 10 mg by mouth daily.    . fluticasone (FLONASE) 50 MCG/ACT nasal spray PLACE 2 SPRAYS INTO BOTH NOSTRILS DAILY. 16 g 2  . Multiple Vitamin (MULTIVITAMIN) tablet Take 1 tablet by mouth daily.     No current facility-administered medications on file prior to visit.   No Known Allergies Social History   Social History  . Marital Status: Married    Spouse Name: N/A  . Number of Children: N/A  . Years of Education: N/A   Occupational History  . Not on file.   Social History Main Topics  . Smoking status: Never Smoker   . Smokeless tobacco: Never Used  . Alcohol Use: No  . Drug Use: No  . Sexual Activity: Not on file   Other Topics Concern  . Not on file   Social History Narrative      Review of Systems  Psychiatric/Behavioral: Positive for sleep disturbance, dysphoric mood and decreased concentration. Negative for suicidal ideas, hallucinations, behavioral problems, confusion and agitation. The patient is nervous/anxious.        Objective:   Physical Exam  Constitutional: He appears well-developed and well-nourished.  Cardiovascular: Normal rate,  regular rhythm, normal heart sounds and intact distal pulses.   Pulmonary/Chest: Effort normal and breath sounds normal. No respiratory distress. He has no wheezes. He has no rales.  Psychiatric: He has a normal mood and affect. His behavior is normal. Judgment and thought content normal.          Assessment & Plan:  GAD (generalized anxiety disorder) - Plan: escitalopram (LEXAPRO) 10 MG tablet, ALPRAZolam (XANAX) 0.5 MG tablet  Depression  Begin lexapro 10 mg poqday and recheck in 4 weeks.  Use xanax 0.5 mg poq8 hrs prn anxiety or insomnia.  DC elavil.  Cautioned against driving within 4-6 hrs of taking xanax.

## 2015-10-30 ENCOUNTER — Emergency Department (INDEPENDENT_AMBULATORY_CARE_PROVIDER_SITE_OTHER)
Admission: EM | Admit: 2015-10-30 | Discharge: 2015-10-30 | Disposition: A | Discharge status: Home / Self Care | Payer: BC Managed Care – PPO | Source: Home / Self Care | Attending: Emergency Medicine | Admitting: Emergency Medicine

## 2015-10-30 ENCOUNTER — Encounter: Payer: Self-pay | Admitting: *Deleted

## 2015-10-30 DIAGNOSIS — H1033 Unspecified acute conjunctivitis, bilateral: Secondary | ICD-10-CM | POA: Diagnosis not present

## 2015-10-30 DIAGNOSIS — J0100 Acute maxillary sinusitis, unspecified: Secondary | ICD-10-CM

## 2015-10-30 MED ORDER — AMOXICILLIN 875 MG PO TABS: ORAL_TABLET | ORAL | Status: DC

## 2015-10-30 MED ORDER — POLYMYXIN B-TRIMETHOPRIM 10000-0.1 UNIT/ML-% OP SOLN: OPHTHALMIC | Status: DC

## 2015-10-30 NOTE — ED Notes (Signed)
Pt c/o 4 days of congestion ear pain, sore throat, cough, eye redness and drainage.

## 2015-10-30 NOTE — ED Provider Notes (Signed)
CSN: [id number]     Arrival date & time 10/30/15  [id number] History   First MD Initiated Contact with Patient 10/30/15 1141     Chief Complaint  Patient presents with  . Nasal Congestion  . Otalgia  . Sore Throat   (Consider location/radiation/quality/duration/timing/severity/associated sxs/prior Treatment) HPI URI HISTORY  Dustin Solis is a 45 y.o. male who complains of onset of cold and cough symptoms, including congestion and ear pain and sore throat, and then developed eye redness and yellow mucoid drainage. All these symptoms for 5 days. Progressively worsening.  Have been using over-the-counter treatment which is not helping.  No chills/sweats +  Fever  +  Nasal congestion +  Discolored Post-nasal drainage No sinus pain/pressure Mild sore throat  +  cough, nonproductive No wheezing Positive chest congestion No hemoptysis No shortness of breath No pleuritic pain  No nausea No vomiting No abdominal pain No diarrhea  No skin rashes +  Fatigue No myalgias +Mild, nonfocal headache   Past Medical History  Diagnosis Date  . CKD (chronic kidney disease), stage I    Past Surgical History  Procedure Laterality Date  . Nasal septum surgery  1999   History reviewed. No pertinent family history. Social History  Substance Use Topics  . Smoking status: Never Smoker   . Smokeless tobacco: Never Used  . Alcohol Use: No    Review of Systems  All other systems reviewed and are negative.   Allergies  Review of patient's allergies indicates no known allergies.  Home Medications   Prior to Admission medications   Medication Sig Start Date End Date Taking? Authorizing Provider  ALPRAZolam Duanne Moron) 0.5 MG tablet Take 1 tablet (0.5 mg total) by mouth 3 (three) times daily as needed for anxiety. 10/12/15   Susy Frizzle, MD  amitriptyline (ELAVIL) 10 MG tablet Take 10 mg by mouth at bedtime as needed for sleep.    Historical Provider, MD  amoxicillin (AMOXIL) 875 MG tablet Take  1 twice a day X 10 days. 10/30/15   Jacqulyn Cane, MD  cetirizine (ZYRTEC) 10 MG tablet Take 10 mg by mouth daily.    Historical Provider, MD  escitalopram (LEXAPRO) 10 MG tablet Take 1 tablet (10 mg total) by mouth at bedtime. 10/12/15   Susy Frizzle, MD  fluticasone (FLONASE) 50 MCG/ACT nasal spray PLACE 2 SPRAYS INTO BOTH NOSTRILS DAILY. 08/03/15   Alycia Rossetti, MD  Multiple Vitamin (MULTIVITAMIN) tablet Take 1 tablet by mouth daily.    Historical Provider, MD  trimethoprim-polymyxin b (POLYTRIM) ophthalmic solution 2 drop in affected eye(s) every 4 hours (while awake) x 7 days 10/30/15   Jacqulyn Cane, MD   Meds Ordered and Administered this Visit  Medications - No data to display  BP 120/81 mmHg  Pulse 82  Temp(Src) 98 F (36.7 C) (Oral)  Wt 182 lb 6.4 oz (82.736 kg)  SpO2 99% No data found.   Physical Exam  Constitutional: He is oriented to person, place, and time. He appears well-developed and well-nourished. No distress.  HENT:  Head: Normocephalic and atraumatic.  Right Ear: Tympanic membrane, external ear and ear canal normal.  Left Ear: Tympanic membrane, external ear and ear canal normal.  Nose: Mucosal edema and rhinorrhea present. Right sinus exhibits maxillary sinus tenderness. Left sinus exhibits maxillary sinus tenderness.  Mouth/Throat: Oropharynx is clear and moist. No oral lesions. No oropharyngeal exudate.  Eyes: EOM are normal. Pupils are equal, round, and reactive to light. Right eye exhibits discharge. Left eye  exhibits discharge. Right conjunctiva is injected. Right conjunctiva has no hemorrhage. Left conjunctiva is injected. Left conjunctiva has no hemorrhage. No scleral icterus.  No foreign body seen. No corneal abrasion seen.  Neck: Neck supple.  Cardiovascular: Normal rate, regular rhythm and normal heart sounds.   Pulmonary/Chest: Effort normal. No respiratory distress. He has no wheezes. He has rhonchi. He has no rales.  Minimal anterior rhonchi.  Otherwise, lung exam normal. Breath sounds equal bilaterally.  Lymphadenopathy:    He has no cervical adenopathy.  Neurological: He is alert and oriented to person, place, and time.  Skin: Skin is warm and dry. No rash noted.  Psychiatric: He has a normal mood and affect.  Nursing note and vitals reviewed.   ED Course  Procedures (including critical care time)  Labs Review Labs Reviewed - No data to display  Imaging Review No results found.   Visual Acuity Review  Right Eye Distance:   grossly intact Left Eye Distance:   grossly intact    MDM   1. Acute maxillary sinusitis, recurrence not specified   2. Acute conjunctivitis of both eyes    Treatment options discussed, as well as risks, benefits, alternatives. Patient voiced understanding and agreement with the following plans:   New Prescriptions   AMOXICILLIN (AMOXIL) 875 MG TABLET    Take 1 twice a day X 10 days.   TRIMETHOPRIM-POLYMYXIN B (POLYTRIM) OPHTHALMIC SOLUTION    2 drop in affected eye(s) every 4 hours (while awake) x 7 days   other advice and symptomatic care discussed. OTC Mucinex D Follow-up with your primary care doctor or eye doctor in 5-7 days if not improving, or sooner if symptoms become worse. Precautions discussed. Red flags discussed. Questions invited and answered. Patient voiced understanding and agreement.     Jacqulyn Cane, MD 10/30/15 Carollee Massed

## 2015-10-31 ENCOUNTER — Telehealth: Payer: Self-pay | Admitting: Family Medicine

## 2015-10-31 NOTE — Telephone Encounter (Signed)
Call placed to patient. LMTRC.  

## 2015-10-31 NOTE — Telephone Encounter (Signed)
Patient wife returned call.   States that he is not sleeping at night since taking ABTx.   Reports that he is taking ABTx at 9am and 9pm. Advised to change ABTx time to 9am and 5pm. Advised to use APAP PM if needed.   Advised to contact office if insomnia worsens.

## 2015-10-31 NOTE — Telephone Encounter (Addendum)
Pt's wife called and states that Dustin Solis had to go to the [facility] yesterday and was DX w/ an URI, bronchitis and double conjunctivitis. Pt can not sleep and is hoping that we may be able to call him in something to help. [place]: [phone]

## 2015-11-01 ENCOUNTER — Ambulatory Visit (INDEPENDENT_AMBULATORY_CARE_PROVIDER_SITE_OTHER): Payer: BC Managed Care – PPO | Admitting: Physician Assistant

## 2015-11-01 ENCOUNTER — Encounter: Payer: Self-pay | Admitting: Family Medicine

## 2015-11-01 ENCOUNTER — Encounter: Payer: Self-pay | Admitting: Physician Assistant

## 2015-11-01 VITALS — BP 112/74 | HR 80 | Temp 98.2°F | Resp 18 | Wt 184.0 lb

## 2015-11-01 DIAGNOSIS — J988 Other specified respiratory disorders: Secondary | ICD-10-CM | POA: Diagnosis not present

## 2015-11-01 DIAGNOSIS — B9689 Other specified bacterial agents as the cause of diseases classified elsewhere: Principal | ICD-10-CM

## 2015-11-01 MED ORDER — LEVOFLOXACIN 750 MG PO TABS: 750.0000 mg | ORAL_TABLET | Freq: Every day | ORAL | Status: DC

## 2015-11-01 MED ORDER — HYDROCOD POLST-CPM POLST ER 10-8 MG/5ML PO SUER: 5.0000 mL | Freq: Two times a day (BID) | ORAL | Status: DC | PRN

## 2015-11-01 NOTE — Progress Notes (Signed)
Patient ID: SIE SMELLIE MRN: [id number], DOB: [birth year], 45 y.o. Date of Encounter: 11/01/2015, 1:13 PM    Chief Complaint:  Chief Complaint  Patient presents with  . sick x 1 week    seen UC Monday, now with fever     HPI: 45 y.o. year old AA male here with his wife.   I reviewed his note from the urgent care from 10/30/15 visit there. At time he reported that he was having cold symptoms, cough symptoms, congestion, ear pain, sore throat. Also eye redness and yellow mucousy drainage from the eyes. At that visit he had had the symptoms for 5 days and they seem to be worsening. At that visit they diagnosed him with acute maxillary sinusitis and acute conjunctivitis. Treated him with amoxicillin 875 mg twice a day 10 days. Also Polytrim ophthalmic solution.  Today they report that he has been taking the amoxicillin and Polytrim as directed. reports that his eye symptoms have resolved. However they report that he has developed a fever that just started yesterday. Wife states that yesterday at 8 PM it was reading 99.9. Last night around 11:30 PM it was 100. This morning at 5:30 AM temperature was 102. Patient states that also last night he got very little sleep secondary to cough and tossing and turning. Says that he is still having a lot of cough and a lot of congestion.     Home Meds:   Outpatient Prescriptions Prior to Visit  Medication Sig Dispense Refill  . ALPRAZolam (XANAX) 0.5 MG tablet Take 1 tablet (0.5 mg total) by mouth 3 (three) times daily as needed for anxiety. 30 tablet 0  . amitriptyline (ELAVIL) 10 MG tablet Take 10 mg by mouth at bedtime as needed for sleep.    Marland Kitchen amoxicillin (AMOXIL) 875 MG tablet Take 1 twice a day X 10 days. 20 tablet 0  . cetirizine (ZYRTEC) 10 MG tablet Take 10 mg by mouth daily.    Marland Kitchen escitalopram (LEXAPRO) 10 MG tablet Take 1 tablet (10 mg total) by mouth at bedtime. 30 tablet 5  . fluticasone (FLONASE) 50 MCG/ACT nasal spray PLACE 2  SPRAYS INTO BOTH NOSTRILS DAILY. 16 g 2  . Multiple Vitamin (MULTIVITAMIN) tablet Take 1 tablet by mouth daily.    Marland Kitchen trimethoprim-polymyxin b (POLYTRIM) ophthalmic solution 2 drop in affected eye(s) every 4 hours (while awake) x 7 days 10 mL 0   No facility-administered medications prior to visit.    Allergies: No Known Allergies    Review of Systems: See HPI for pertinent ROS. All other ROS negative.    Physical Exam: Blood pressure 112/74, pulse 80, temperature 98.2 F (36.8 C), temperature source Oral, resp. rate 18, weight 184 lb (83.462 kg)., Body mass index is 28.81 kg/(m^2). General:  WNWD AAM. Appears in no acute distress. HEENT: Normocephalic, atraumatic, eyes without discharge, sclera non-icteric, nares are without discharge. Bilateral auditory canals clear, TM's are without perforation, pearly grey and translucent with reflective cone of light bilaterally. Oral cavity moist, posterior pharynx without exudate, erythema, peritonsillar abscess.  Neck: Supple. No thyromegaly. No lymphadenopathy. Lungs: Clear bilaterally to auscultation without wheezes, rales, or rhonchi. Breathing is unlabored. Heart: Regular rhythm. No murmurs, rubs, or gallops. Msk:  Strength and tone normal for age. Extremities/Skin: Warm and dry. Neuro: Alert and oriented X 3. Moves all extremities spontaneously. Gait is normal. CNII-XII grossly in tact. Psych:  Responds to questions appropriately with a normal affect.     ASSESSMENT AND PLAN:  45 y.o. year old male with  1. Bacterial respiratory infection He is to stop the amoxicillin. Start Levaquin and take as directed and complete all of it. He is to complete the course of Polytrim. Follow-up if fever is not decreasing in 24 hours. Follow-up if fever does not resolve and symptoms do not resolve upon completion of Levaquin. Will give Tussionex for him to use especially at bedtime so that he can get some sleep.  - levofloxacin (LEVAQUIN) 750 MG  tablet; Take 1 tablet (750 mg total) by mouth daily.  Dispense: 7 tablet; Refill: 0 - chlorpheniramine-HYDROcodone (TUSSIONEX PENNKINETIC ER) 10-8 MG/5ML SUER; Take 5 mLs by mouth every 12 (twelve) hours as needed for cough.  Dispense: 140 mL; Refill: 0   Signed, [address] [place], [place], [facility] 11/01/2015 1:13 PM

## 2015-11-13 ENCOUNTER — Ambulatory Visit: Payer: BC Managed Care – PPO | Admitting: Family Medicine

## 2015-11-17 ENCOUNTER — Encounter: Payer: Self-pay | Admitting: Family Medicine

## 2015-11-17 ENCOUNTER — Ambulatory Visit (INDEPENDENT_AMBULATORY_CARE_PROVIDER_SITE_OTHER): Payer: BC Managed Care – PPO | Admitting: Family Medicine

## 2015-11-17 VITALS — BP 110/78 | HR 80 | Temp 97.8°F | Resp 16 | Wt 182.0 lb

## 2015-11-17 DIAGNOSIS — F411 Generalized anxiety disorder: Secondary | ICD-10-CM | POA: Diagnosis not present

## 2015-11-17 MED ORDER — AZELASTINE HCL 0.1 % NA SOLN: 2.0000 | Freq: Two times a day (BID) | NASAL | Status: DC

## 2015-11-17 NOTE — Progress Notes (Signed)
Subjective:    Patient ID: Dustin Solis, male    DOB: [birth year], 45 y.o.   MRN: [id number]  HPI 10/12/15 Reports depression for 6 months.  REports daily anxiety and occasional panic attacks.  Has trouble sleeping due to his mind ruminating over stress in his life and because of anxiety.  He has no energy.  Hard to even motivate himself to get out of bed in the morning.  Denies SI.  Does have poor appetite and daily nausea triggered by anxiety.  At that time, my plan was: Begin lexapro 10 mg poqday and recheck in 4 weeks.  Use xanax 0.5 mg poq8 hrs prn anxiety or insomnia.  DC elavil.  Cautioned against driving within 4-6 hrs of taking xanax.    11/17/15 Patient feels much better on Lexapro. He is only had 5 panic attacks in the last month. He has had none in the last 2 weeks. He seems to be doing well. He is not taking Xanax at all. However he does report chronic sinusitis with pressure in both maxillary sinuses. He reports rhinorrhea and nasal congestion. He is currently on Zyrtec as well as Flonase. Past Medical History  Diagnosis Date  . CKD (chronic kidney disease), stage I    Past Surgical History  Procedure Laterality Date  . Nasal septum surgery  1999   Current Outpatient Prescriptions on File Prior to Visit  Medication Sig Dispense Refill  . ALPRAZolam (XANAX) 0.5 MG tablet Take 1 tablet (0.5 mg total) by mouth 3 (three) times daily as needed for anxiety. 30 tablet 0  . cetirizine (ZYRTEC) 10 MG tablet Take 10 mg by mouth daily.    Marland Kitchen escitalopram (LEXAPRO) 10 MG tablet Take 1 tablet (10 mg total) by mouth at bedtime. 30 tablet 5  . fluticasone (FLONASE) 50 MCG/ACT nasal spray PLACE 2 SPRAYS INTO BOTH NOSTRILS DAILY. 16 g 2  . Multiple Vitamin (MULTIVITAMIN) tablet Take 1 tablet by mouth daily.    Marland Kitchen amitriptyline (ELAVIL) 10 MG tablet Take 10 mg by mouth at bedtime as needed for sleep. Reported on 11/17/2015     No current facility-administered medications on file prior to  visit.   No Known Allergies Social History   Social History  . Marital Status: Married    Spouse Name: N/A  . Number of Children: N/A  . Years of Education: N/A   Occupational History  . Not on file.   Social History Main Topics  . Smoking status: Never Smoker   . Smokeless tobacco: Never Used  . Alcohol Use: No  . Drug Use: No  . Sexual Activity: Not on file   Other Topics Concern  . Not on file   Social History Narrative      Review of Systems  Psychiatric/Behavioral: Positive for sleep disturbance, dysphoric mood and decreased concentration. Negative for suicidal ideas, hallucinations, behavioral problems, confusion and agitation. The patient is nervous/anxious.        Objective:   Physical Exam  Constitutional: He appears well-developed and well-nourished.  Cardiovascular: Normal rate, regular rhythm, normal heart sounds and intact distal pulses.   Pulmonary/Chest: Effort normal and breath sounds normal. No respiratory distress. He has no wheezes. He has no rales.  Psychiatric: He has a normal mood and affect. His behavior is normal. Judgment and thought content normal.          Assessment & Plan:  GAD (generalized anxiety disorder)  Continue Lexapro 10 mg by mouth daily. Discontinue Xanax. Add Astelin  nasal spray 2 sprays each nostril twice daily to Flonase and Zyrtec. If symptoms do not improve, I will consult an allergist for allergy testing as the patient's chronic rhinorrhea has gone on for years

## 2016-01-03 ENCOUNTER — Ambulatory Visit (INDEPENDENT_AMBULATORY_CARE_PROVIDER_SITE_OTHER): Payer: BC Managed Care – PPO | Admitting: Family Medicine

## 2016-01-03 ENCOUNTER — Encounter: Payer: Self-pay | Admitting: Family Medicine

## 2016-01-03 VITALS — BP 130/80 | HR 78 | Temp 98.5°F | Resp 16 | Wt 185.0 lb

## 2016-01-03 DIAGNOSIS — J309 Allergic rhinitis, unspecified: Secondary | ICD-10-CM | POA: Diagnosis not present

## 2016-01-03 DIAGNOSIS — N181 Chronic kidney disease, stage 1: Secondary | ICD-10-CM

## 2016-01-03 DIAGNOSIS — J329 Chronic sinusitis, unspecified: Secondary | ICD-10-CM | POA: Insufficient documentation

## 2016-01-03 DIAGNOSIS — J320 Chronic maxillary sinusitis: Secondary | ICD-10-CM

## 2016-01-03 LAB — CBC WITH DIFFERENTIAL/PLATELET
Basophils Absolute: 0 10*3/uL (ref 0.0–0.1)
Basophils Relative: 0 % (ref 0–1)
EOS PCT: 1 % (ref 0–5)
Eosinophils Absolute: 0.1 10*3/uL (ref 0.0–0.7)
HEMATOCRIT: 45.8 % (ref 39.0–52.0)
HEMOGLOBIN: 15.6 g/dL (ref 13.0–17.0)
LYMPHS PCT: 35 % (ref 12–46)
Lymphs Abs: 2 10*3/uL (ref 0.7–4.0)
MCH: 29.2 pg (ref 26.0–34.0)
MCHC: 34.1 g/dL (ref 30.0–36.0)
MCV: 85.6 fL (ref 78.0–100.0)
MONO ABS: 0.5 10*3/uL (ref 0.1–1.0)
MONOS PCT: 8 % (ref 3–12)
MPV: 9 fL (ref 8.6–12.4)
NEUTROS ABS: 3.2 10*3/uL (ref 1.7–7.7)
Neutrophils Relative %: 56 % (ref 43–77)
Platelets: 434 10*3/uL — ABNORMAL HIGH (ref 150–400)
RBC: 5.35 MIL/uL (ref 4.22–5.81)
RDW: 12.9 % (ref 11.5–15.5)
WBC: 5.7 10*3/uL (ref 4.0–10.5)

## 2016-01-03 LAB — COMPLETE METABOLIC PANEL WITH GFR
ALBUMIN: 4.6 g/dL (ref 3.6–5.1)
ALK PHOS: 186 U/L — AB (ref 40–115)
ALT: 82 U/L — AB (ref 9–46)
AST: 52 U/L — AB (ref 10–40)
BUN: 14 mg/dL (ref 7–25)
CALCIUM: 10.4 mg/dL — AB (ref 8.6–10.3)
CHLORIDE: 98 mmol/L (ref 98–110)
CO2: 29 mmol/L (ref 20–31)
CREATININE: 1.38 mg/dL — AB (ref 0.60–1.35)
GFR, Est African American: 70 mL/min (ref >=60)
GFR, Est Non African American: 61 mL/min (ref >=60)
GLUCOSE: 78 mg/dL (ref 70–99)
POTASSIUM: 5 mmol/L (ref 3.5–5.3)
SODIUM: 135 mmol/L (ref 135–146)
Total Bilirubin: 0.4 mg/dL (ref 0.2–1.2)
Total Protein: 8 g/dL (ref 6.1–8.1)

## 2016-01-03 MED ORDER — ACETAMINOPHEN-CODEINE #3 300-30 MG PO TABS: 1.0000 | ORAL_TABLET | Freq: Four times a day (QID) | ORAL | Status: DC | PRN

## 2016-01-03 NOTE — Progress Notes (Signed)
Patient ID: Dustin Solis, male   DOB: 1971/06/25, 45 y.o.   MRN: [id number]    Subjective:    Patient ID: Dustin Solis, male    DOB: January 22, 1971, 45 y.o.   MRN: [id number]  Patient presents for Sinusitis   Patient here to follow-up chronic sinusitis. His history of chronic sinusitis he has sinus surgery done in the 90s. He has had difficulties with allergies since his childhood. He's been taking over-the-counter antihistamines with minimal improvement. He also has Astelin and Flonase nasal sprays which help. Recently he had a tooth extracted on the 28th it seems that he had some type of infection he had some swelling on the left side of his face there was less pain. He is currently on Augmentin which he started on March 4. He still gets pressure pain from his sinus issues and he was a 5 to follow-up in our office if his symptoms did not improve. He has not had any fever. For his primary care providers last note consider going to an allergist.   for pain he has been taking a lot of ibuprofen he was on 800 mg 3 times a day he has decreased this to 400 mg along with Tylenol. He does have underlying chronic kidney disease and he was concerned about this and the amount of ibuprofen.  Review Of Systems:  GEN- denies fatigue, fever, weight loss,weakness, recent illness HEENT- denies eye drainage, change in vision, +nasal discharge, CVS- denies chest pain, palpitations RESP- denies SOB, cough, wheeze ABD- denies N/V, change in stools, abd pain GU- denies dysuria, hematuria, dribbling, incontinence MSK- denies joint pain, muscle aches, injury Neuro- + headache, dizziness, syncope, seizure activity       Objective:    BP 130/80 mmHg  Pulse 78  Temp(Src) 98.5 F (36.9 C) (Oral)  Resp 16  Wt 185 lb (83.915 kg) GEN- NAD, alert and oriented x3 HEENT- PERRL, EOMI, non injected sclera, pink conjunctiva, MMM, oropharynx clear, TM clear bilat no effusion, tooth abstraction site, mild swelling, no  bogginess, mild TTP + maxillary sinus tenderness, inflammed turbinates,  Nasal drainage  Neck- Supple, no LAD CVS- RRR, no murmur RESP-CTAB Pulses- Radial 2+         Assessment & Plan:      Problem List Items Addressed This Visit    Sinusitis, chronic    Worsening symptoms and underlying allergies, obtain CT max , face, no dental abscess. Complete augmentin, sudafed for total of 5 days, D/C Afrin, continue astelin      Relevant Medications   amoxicillin-clavulanate (AUGMENTIN) 875-125 MG tablet   Other Relevant Orders   CT Maxillofacial WO CM   CKD (chronic kidney disease), stage I - Primary    Recheck renal function. Advised to D/C ibuprofen Given Tylenol #3      Relevant Orders   COMPLETE METABOLIC PANEL WITH GFR   CBC with Differential/Platelet   Chronic allergic rhinitis    Continue allergy meds, consider Singulair, pending CT scan refer to allergist unless ENT needs to intervene      Relevant Orders   CT Maxillofacial WO CM      Note: This dictation was prepared with Dragon dictation along with smaller phrase technology. Any transcriptional errors that result from this process are unintentional.

## 2016-01-03 NOTE — Assessment & Plan Note (Signed)
Recheck renal function. Advised to D/C ibuprofen Given Tylenol #3

## 2016-01-03 NOTE — Patient Instructions (Addendum)
Stop Afrin  Sudafed no more than 5 days Stick Asteline and Flonase  CT maxillary face for sinuses  Stop Ibuprofen  Complete antibiotics  Singulair- look into this- we will hold on prescribing until scan seen  Give Work Note -Can return on 01/08/16 F/u PENDING RESULTS

## 2016-01-03 NOTE — Assessment & Plan Note (Signed)
Worsening symptoms and underlying allergies, obtain CT max , face, no dental abscess. Complete augmentin, sudafed for total of 5 days, D/C Afrin, continue astelin

## 2016-01-03 NOTE — Assessment & Plan Note (Signed)
Continue allergy meds, consider Singulair, pending CT scan refer to allergist unless ENT needs to intervene

## 2016-01-05 ENCOUNTER — Ambulatory Visit
Admission: RE | Admit: 2016-01-05 | Discharge: 2016-01-05 | Disposition: A | Discharge status: Home / Self Care | Payer: BC Managed Care – PPO | Source: Ambulatory Visit | Attending: Family Medicine | Admitting: Family Medicine

## 2016-01-05 ENCOUNTER — Other Ambulatory Visit: Payer: Self-pay | Admitting: *Deleted

## 2016-01-05 DIAGNOSIS — J320 Chronic maxillary sinusitis: Secondary | ICD-10-CM

## 2016-01-05 DIAGNOSIS — J328 Other chronic sinusitis: Secondary | ICD-10-CM

## 2016-01-05 DIAGNOSIS — R7989 Other specified abnormal findings of blood chemistry: Secondary | ICD-10-CM

## 2016-01-05 DIAGNOSIS — R945 Abnormal results of liver function studies: Principal | ICD-10-CM

## 2016-01-05 DIAGNOSIS — J309 Allergic rhinitis, unspecified: Secondary | ICD-10-CM

## 2016-01-08 ENCOUNTER — Telehealth: Payer: Self-pay | Admitting: Family Medicine

## 2016-01-08 ENCOUNTER — Telehealth: Payer: Self-pay | Admitting: *Deleted

## 2016-01-08 NOTE — Telephone Encounter (Signed)
Pt wife called back and aware of appt 

## 2016-01-08 NOTE — Telephone Encounter (Signed)
FMLA FORMS ROUTED ON 01/08/16

## 2016-01-08 NOTE — Telephone Encounter (Signed)
Pt has appt scheduled at [facility] for [place] Abd limited RUQ on Monday Mar 20 at 8:45am, pt is to arrive at 8:25am at [address], pt is to have nothing to eat or drink after midnight, lmtrc to pt for appt information

## 2016-01-09 ENCOUNTER — Other Ambulatory Visit: Payer: BC Managed Care – PPO

## 2016-01-09 NOTE — Telephone Encounter (Signed)
lmtrc to pt, need to know what pt is needing FMLA, job title and duties that he performs and hours.

## 2016-01-09 NOTE — Telephone Encounter (Signed)
Pt returned call, stating FMLA is for when he has been out of work for more than 10 days under MD care to cover him while out.  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 Buelah Manis basket

## 2016-01-10 ENCOUNTER — Ambulatory Visit (HOSPITAL_BASED_OUTPATIENT_CLINIC_OR_DEPARTMENT_OTHER)
Admission: RE | Admit: 2016-01-10 | Discharge: 2016-01-10 | Disposition: A | Discharge status: Home / Self Care | Payer: BC Managed Care – PPO | Source: Ambulatory Visit | Attending: Family Medicine | Admitting: Family Medicine

## 2016-01-10 DIAGNOSIS — R7989 Other specified abnormal findings of blood chemistry: Secondary | ICD-10-CM | POA: Diagnosis present

## 2016-01-10 DIAGNOSIS — R945 Abnormal results of liver function studies: Secondary | ICD-10-CM

## 2016-01-10 NOTE — Telephone Encounter (Signed)
Please call pt, I have his FMLA request,  I saw him on 3/8 and took him out the rest of the week- 3 days, he does not work on weekend, return date of Monday 3/13   I can not legally cover his FMLA from 2/27-3/7, unless he has some written documentation from his dentist that I can attack to this FMLA   Also give him [place] results in your box

## 2016-01-11 NOTE — Telephone Encounter (Signed)
LMTRC

## 2016-01-12 NOTE — Telephone Encounter (Signed)
Pt aware of FMLA ppw and will be bringing form documentation from his dentist

## 2016-01-15 ENCOUNTER — Other Ambulatory Visit: Payer: BC Managed Care – PPO

## 2016-01-15 ENCOUNTER — Encounter: Payer: Self-pay | Admitting: *Deleted

## 2016-01-15 NOTE — Telephone Encounter (Signed)
Contacted pt informed him FMLA ppw ready, left message needs to contact office to pay form fee of 20

## 2016-01-15 NOTE — Telephone Encounter (Signed)
This encounter was created in error - please disregard.

## 2016-01-23 ENCOUNTER — Ambulatory Visit (INDEPENDENT_AMBULATORY_CARE_PROVIDER_SITE_OTHER): Payer: BC Managed Care – PPO | Admitting: Family Medicine

## 2016-01-23 ENCOUNTER — Encounter: Payer: Self-pay | Admitting: Family Medicine

## 2016-01-23 ENCOUNTER — Other Ambulatory Visit: Payer: Self-pay | Admitting: Family Medicine

## 2016-01-23 VITALS — BP 108/72 | HR 72 | Temp 98.0°F | Resp 14 | Ht 67.0 in | Wt 182.0 lb

## 2016-01-23 DIAGNOSIS — F411 Generalized anxiety disorder: Secondary | ICD-10-CM

## 2016-01-23 MED ORDER — AMITRIPTYLINE HCL 10 MG PO TABS: 10.0000 mg | ORAL_TABLET | Freq: Every evening | ORAL | Status: DC | PRN

## 2016-01-23 NOTE — Telephone Encounter (Signed)
Medication refilled per protocol. 

## 2016-01-23 NOTE — Progress Notes (Signed)
Subjective:    Patient ID: Dustin Solis, male    DOB: [birth year], 45 y.o.   MRN: [id number]  HPI 10/12/15 Reports depression for 6 months.  REports daily anxiety and occasional panic attacks.  Has trouble sleeping due to his mind ruminating over stress in his life and because of anxiety.  He has no energy.  Hard to even motivate himself to get out of bed in the morning.  Denies SI.  Does have poor appetite and daily nausea triggered by anxiety.  At that time, my plan was: Begin lexapro 10 mg poqday and recheck in 4 weeks.  Use xanax 0.5 mg poq8 hrs prn anxiety or insomnia.  DC elavil.  Cautioned against driving within 4-6 hrs of taking xanax.    11/17/15 Patient feels much better on Lexapro. He is only had 5 panic attacks in the last month. He has had none in the last 2 weeks. He seems to be doing well. He is not taking Xanax at all. However he does report chronic sinusitis with pressure in both maxillary sinuses. He reports rhinorrhea and nasal congestion. He is currently on Zyrtec as well as Flonase.  At that time, my plan was: Continue Lexapro 10 mg by mouth daily. Discontinue Xanax. Add Astelin nasal spray 2 sprays each nostril twice daily to Flonase and Zyrtec. If symptoms do not improve, I will consult an allergist for allergy testing as the patient's chronic rhinorrhea has gone on for years  01/23/16 Unfortunately, the patient seems to have a setback. His anxiety seems to be worse. 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 Every time he drives, he becomes so anxious that he is unable to focus. He will frequently missed stops on his Route. He also almost hit a person due to lack of focus. He also continues to have trouble sleeping. He feels constantly anxious and continues to have full-blown panic attacks. He also reports anhedonia and poor energy. It seems as though the Lexapro is no longer helping. Past Medical History  Diagnosis Date  . CKD (chronic kidney disease),  stage I    Past Surgical History  Procedure Laterality Date  . Nasal septum surgery  1999   Current Outpatient Prescriptions on File Prior to Visit  Medication Sig Dispense Refill  . acetaminophen-codeine (TYLENOL #3) 300-30 MG tablet Take 1 tablet by mouth every 6 (six) hours as needed for moderate pain. 20 tablet 0  . ALPRAZolam (XANAX) 0.5 MG tablet Take 1 tablet (0.5 mg total) by mouth 3 (three) times daily as needed for anxiety. 30 tablet 0  . amitriptyline (ELAVIL) 10 MG tablet Take 10 mg by mouth at bedtime as needed for sleep. Reported on 11/17/2015    . amoxicillin-clavulanate (AUGMENTIN) 875-125 MG tablet     . azelastine (ASTELIN) 0.1 % nasal spray Place 2 sprays into both nostrils 2 (two) times daily. Use in each nostril as directed 30 mL 12  . cetirizine (ZYRTEC) 10 MG tablet Take 10 mg by mouth daily.    Marland Kitchen escitalopram (LEXAPRO) 10 MG tablet Take 1 tablet (10 mg total) by mouth at bedtime. 30 tablet 5  . fluticasone (FLONASE) 50 MCG/ACT nasal spray PLACE 2 SPRAYS INTO BOTH NOSTRILS DAILY. 16 g 2  . Multiple Vitamin (MULTIVITAMIN) tablet Take 1 tablet by mouth daily.     No current facility-administered medications on file prior to visit.   No Known Allergies Social History   Social History  .  Marital Status: Married    Spouse Name: N/A  . Number of Children: N/A  . Years of Education: N/A   Occupational History  . Not on file.   Social History Main Topics  . Smoking status: Never Smoker   . Smokeless tobacco: Never Used  . Alcohol Use: No  . Drug Use: No  . Sexual Activity: Not on file   Other Topics Concern  . Not on file   Social History Narrative      Review of Systems  Psychiatric/Behavioral: Positive for sleep disturbance, dysphoric mood and decreased concentration. Negative for suicidal ideas, hallucinations, behavioral problems, confusion and agitation. The patient is nervous/anxious.        Objective:   Physical Exam  Constitutional: He  appears well-developed and well-nourished.  Cardiovascular: Normal rate, regular rhythm, normal heart sounds and intact distal pulses.   Pulmonary/Chest: Effort normal and breath sounds normal. No respiratory distress. He has no wheezes. He has no rales.  Psychiatric: He has a normal mood and affect. His behavior is normal. Judgment and thought content normal.          Assessment & Plan:  GAD (generalized anxiety disorder)  At this point, I recommended seeing a psychiatrist. One option would be to discontinue Lexapro and replaced with Trintellix.  However since this seems to be more of a medical legal issue pertaining to his work, I believe that his best option would be to a specialist/psychiatrist. Therefore I recommended that he call his insurance to determine which psychiatrist or a network. I would like to review this list that I can make a recommendation regarding who I think he would do well to see. The patient is to call me back with his in network providers as soon as possible

## 2016-02-02 ENCOUNTER — Telehealth: Payer: Self-pay | Admitting: Family Medicine

## 2016-02-19 ENCOUNTER — Telehealth: Payer: Self-pay | Admitting: Family Medicine

## 2016-02-19 NOTE — Telephone Encounter (Signed)
[facility] - do not know what this is in regards to and do we have a signed consent to send records to this center?

## 2016-02-19 NOTE — Telephone Encounter (Signed)
Called and spoke to Juliann Pulse - this is Dr. [facility] sinus surgery facility and they were wanting to know about his CKD. Notes and labs faxed through [facility].

## 2016-02-19 NOTE — Telephone Encounter (Signed)
Call placed to patient wife.   Reports that patient has been scheduled for surgery on 02/28/2016, and will need to be out of work x7 days after, making return to work date 03/06/2016, not 01/08/2016.  Advised that MD placed patient out of work for acute sinusitis, and if patient requires FMLA for surgery, those should be routed to Dr. Simeon Craft.

## 2016-02-19 NOTE — Telephone Encounter (Signed)
Patients wife calling to speak to you regarding his fmla paperwork  Please call her back at [phone]

## 2016-02-19 NOTE — Telephone Encounter (Signed)
Kathy @ [facility] left a VM stating that she has been waiting on stat records per the anesthesiologists' request for his upcoming surgery. Please fax last OV and most recent labs to [phone] or call Juliann Pulse @ [phone] ext: [phone] with any questions

## 2016-03-24 ENCOUNTER — Encounter: Payer: Self-pay | Admitting: Family Medicine

## 2016-04-23 ENCOUNTER — Ambulatory Visit (INDEPENDENT_AMBULATORY_CARE_PROVIDER_SITE_OTHER): Payer: BC Managed Care – PPO | Admitting: Family Medicine

## 2016-05-02 ENCOUNTER — Ambulatory Visit (INDEPENDENT_AMBULATORY_CARE_PROVIDER_SITE_OTHER): Payer: BC Managed Care – PPO | Admitting: Family Medicine

## 2016-05-02 ENCOUNTER — Encounter: Payer: Self-pay | Admitting: Family Medicine

## 2016-05-02 VITALS — BP 146/100 | HR 74 | Temp 97.9°F | Resp 14 | Ht 67.0 in | Wt 178.0 lb

## 2016-05-02 DIAGNOSIS — R7989 Other specified abnormal findings of blood chemistry: Secondary | ICD-10-CM

## 2016-05-02 DIAGNOSIS — R5382 Chronic fatigue, unspecified: Secondary | ICD-10-CM

## 2016-05-02 DIAGNOSIS — R945 Abnormal results of liver function studies: Principal | ICD-10-CM

## 2016-05-02 NOTE — Progress Notes (Signed)
Subjective:    Patient ID: Dustin Solis, male    DOB: [birth year], 45 y.o.   MRN: [id number]  HPI Since I last saw the patient, he had lab work which revealed chronic kidney disease stage I along with elevations in his liver function test. At that time a right upper quadrant ultrasound revealed no structural abnormalities in the liver. He is due to fall that back up. Around the same time he developed a severe upper respiratory infection/sinus infection. Since that time he reports extreme fatigue and lethargy and hypersomnolence. He is also recently started seeing a psychiatrist who switches Lexapro to Luvox. They decreased his amitriptyline and started him on Lamictal 200 mg a day. No one has checked his liver function tests since that time. He denies any snoring or apnea. However he is also not awake when that happens. He does report hypersomnolence. His blood pressure today is significantly elevated. Past Medical History  Diagnosis Date  . CKD (chronic kidney disease), stage I    Past Surgical History  Procedure Laterality Date  . Nasal septum surgery  1999   Current Outpatient Prescriptions on File Prior to Visit  Medication Sig Dispense Refill  . ALPRAZolam (XANAX) 0.5 MG tablet Take 1 tablet (0.5 mg total) by mouth 3 (three) times daily as needed for anxiety. 30 tablet 0  . amitriptyline (ELAVIL) 10 MG tablet Take 1 tablet (10 mg total) by mouth at bedtime as needed for sleep. Reported on 11/17/2015 (Patient taking differently: Take 5 mg by mouth at bedtime as needed for sleep. Reported on 11/17/2015) 30 tablet 5  . azelastine (ASTELIN) 0.1 % nasal spray Place 2 sprays into both nostrils 2 (two) times daily. Use in each nostril as directed 30 mL 12  . cetirizine (ZYRTEC) 10 MG tablet Take 10 mg by mouth daily.    Marland Kitchen EPIPEN 2-PAK 0.3 MG/0.3ML SOAJ injection     . fluticasone (FLONASE) 50 MCG/ACT nasal spray PLACE 2 SPRAYS INTO BOTH NOSTRILS DAILY. 16 g 2  . montelukast (SINGULAIR) 10 MG  tablet     . Multiple Vitamin (MULTIVITAMIN) tablet Take 1 tablet by mouth daily.     No current facility-administered medications on file prior to visit.   No Known Allergies Social History   Social History  . Marital Status: Married    Spouse Name: N/A  . Number of Children: N/A  . Years of Education: N/A   Occupational History  . Not on file.   Social History Main Topics  . Smoking status: Never Smoker   . Smokeless tobacco: Never Used  . Alcohol Use: No  . Drug Use: No  . Sexual Activity: Not on file   Other Topics Concern  . Not on file   Social History Narrative      Review of Systems  All other systems reviewed and are negative.      Objective:   Physical Exam  Constitutional: He appears well-developed and well-nourished. No distress.  Neck: Neck supple. No thyromegaly present.  Cardiovascular: Normal rate, regular rhythm and normal heart sounds.   Pulmonary/Chest: Effort normal and breath sounds normal.  Abdominal: Soft. Bowel sounds are normal.  Lymphadenopathy:    He has no cervical adenopathy.  Skin: He is not diaphoretic.  Vitals reviewed.         Assessment & Plan:  Elevated LFTs - Plan: COMPLETE METABOLIC PANEL WITH GFR, EBV ab to viral capsid ag pnl, IgG+IgM  Chronic fatigue  I'm concerned by his recent  elevations in liver function test prior to any medications that may cause this. He denies a high fat diet. He tries to exercise. Therefore, repeat liver function tests today. Given his profound fatigue and his recent viral infection around the same time his liver function tests were elevated, I will check EBV titers to see if this could be a possible explanation if labs are normal, I would then consider a sleep study to evaluate sleep apnea as a cause of his severe fatigue. I will also check a TSH, and a testosterone level along with a CBC. Irises liver function tests are still elevated, I will workup further with a viral hepatitis panel, and  evaluation for hemachromatosis, etc.

## 2016-05-03 ENCOUNTER — Ambulatory Visit: Payer: BC Managed Care – PPO | Admitting: *Deleted

## 2016-05-03 ENCOUNTER — Telehealth: Payer: Self-pay | Admitting: *Deleted

## 2016-05-03 VITALS — BP 154/82

## 2016-05-03 DIAGNOSIS — R030 Elevated blood-pressure reading, without diagnosis of hypertension: Secondary | ICD-10-CM

## 2016-05-03 LAB — COMPLETE METABOLIC PANEL WITH GFR
ALT: 27 U/L (ref 9–46)
AST: 32 U/L (ref 10–40)
Albumin: 4.9 g/dL (ref 3.6–5.1)
Alkaline Phosphatase: 94 U/L (ref 40–115)
BILIRUBIN TOTAL: 0.6 mg/dL (ref 0.2–1.2)
BUN: 15 mg/dL (ref 7–25)
CHLORIDE: 102 mmol/L (ref 98–110)
CO2: 26 mmol/L (ref 20–31)
CREATININE: 1.51 mg/dL — AB (ref 0.60–1.35)
Calcium: 10.1 mg/dL (ref 8.6–10.3)
GFR, EST NON AFRICAN AMERICAN: 55 mL/min — AB (ref >=60)
GFR, Est African American: 63 mL/min (ref >=60)
GLUCOSE: 91 mg/dL (ref 70–99)
Potassium: 4.6 mmol/L (ref 3.5–5.3)
SODIUM: 136 mmol/L (ref 135–146)
TOTAL PROTEIN: 7.8 g/dL (ref 6.1–8.1)

## 2016-05-03 NOTE — Telephone Encounter (Signed)
Patient seen in office to have BP re-checked.   BP noted 154/ 82.  MD please advise.

## 2016-05-06 ENCOUNTER — Other Ambulatory Visit: Payer: Self-pay | Admitting: *Deleted

## 2016-05-06 LAB — EPSTEIN-BARR VIRUS VCA, IGG: EBV VCA IgG: >750 U/mL — ABNORMAL HIGH

## 2016-05-06 LAB — EPSTEIN-BARR VIRUS VCA, IGM: EBV VCA IgM: <36 U/mL

## 2016-05-06 MED ORDER — FLUTICASONE PROPIONATE 50 MCG/ACT NA SUSP: NASAL | Status: DC

## 2016-05-06 MED ORDER — AMLODIPINE BESYLATE 10 MG PO TABS: 10.0000 mg | ORAL_TABLET | Freq: Every day | ORAL | Status: DC

## 2016-05-06 NOTE — Telephone Encounter (Signed)
Received fax requesting refill on Flonase.   Refill appropriate and filled per protocol.  

## 2016-05-06 NOTE — Telephone Encounter (Signed)
Patient wife returned call and made aware.   Prescription sent to pharmacy.

## 2016-05-06 NOTE — Telephone Encounter (Signed)
Given his renal damage, we need to control his BP better.  Begin amlodipine 10 mg poqday and recheck bp in 1 month.

## 2016-05-06 NOTE — Telephone Encounter (Signed)
Call placed to patient. LMTRC.  

## 2016-05-07 ENCOUNTER — Encounter: Payer: Self-pay | Admitting: Family Medicine

## 2016-05-07 ENCOUNTER — Ambulatory Visit (INDEPENDENT_AMBULATORY_CARE_PROVIDER_SITE_OTHER): Payer: BC Managed Care – PPO | Admitting: Family Medicine

## 2016-05-07 ENCOUNTER — Other Ambulatory Visit: Payer: Self-pay | Admitting: Family Medicine

## 2016-05-07 ENCOUNTER — Ambulatory Visit: Payer: BC Managed Care – PPO | Admitting: Family Medicine

## 2016-05-07 VITALS — BP 118/76 | HR 78 | Temp 98.0°F | Resp 14 | Ht 67.0 in | Wt 180.0 lb

## 2016-05-07 DIAGNOSIS — R030 Elevated blood-pressure reading, without diagnosis of hypertension: Secondary | ICD-10-CM | POA: Diagnosis not present

## 2016-05-07 DIAGNOSIS — R944 Abnormal results of kidney function studies: Secondary | ICD-10-CM

## 2016-05-07 DIAGNOSIS — R5382 Chronic fatigue, unspecified: Secondary | ICD-10-CM

## 2016-05-07 DIAGNOSIS — R5383 Other fatigue: Secondary | ICD-10-CM

## 2016-05-07 DIAGNOSIS — N181 Chronic kidney disease, stage 1: Secondary | ICD-10-CM

## 2016-05-07 NOTE — Progress Notes (Signed)
Subjective:    Patient ID: Dustin Solis, male    DOB: 1971/02/12, 45 y.o.   MRN: [id number]  HPI 05/02/16 Since I last saw the patient, he had lab work which revealed chronic kidney disease stage I along with elevations in his liver function test. At that time a right upper quadrant ultrasound revealed no structural abnormalities in the liver. He is due to fall that back up. Around the same time he developed a severe upper respiratory infection/sinus infection. Since that time he reports extreme fatigue and lethargy and hypersomnolence. He is also recently started seeing a psychiatrist who switches Lexapro to Luvox. They decreased his amitriptyline and started him on Lamictal 200 mg a day. No one has checked his liver function tests since that time. He denies any snoring or apnea. However he is also not awake when that happens. He does report hypersomnolence. His blood pressure today is significantly elevated.  At that time, my plan was: I'm concerned by his recent elevations in liver function test prior to any medications that may cause this. He denies a high fat diet. He tries to exercise. Therefore, repeat liver function tests today. Given his profound fatigue and his recent viral infection around the same time his liver function tests were elevated, I will check EBV titers to see if this could be a possible explanation if labs are normal, I would then consider a sleep study to evaluate sleep apnea as a cause of his severe fatigue. I will also check a TSH, and a testosterone level along with a CBC. Irises liver function tests are still elevated, I will workup further with a viral hepatitis panel, and evaluation for hemachromatosis, etc.   05/07/16 Patient's blood pressure was elevated last office visit. Since that time I have recommended amlodipine 10 mg by mouth daily. His liver function tests have normalized. Lab work showed no evidence of recent EBV infection. Therefore he would like to proceed  with further workup for fatigue including a CBC, TSH, and a testosterone level. We had a long discussion today about possibly being psychosomatic related to his anxiety. Unfortunately his renal function continues to worsen. His creatinine is down to 1.5. Although mild, I have no underlying cause for this. I recommended a renal ultrasound Past Medical History  Diagnosis Date  . CKD (chronic kidney disease), stage I    Past Surgical History  Procedure Laterality Date  . Nasal septum surgery  1999   Current Outpatient Prescriptions on File Prior to Visit  Medication Sig Dispense Refill  . ALPRAZolam (XANAX) 0.5 MG tablet Take 1 tablet (0.5 mg total) by mouth 3 (three) times daily as needed for anxiety. 30 tablet 0  . amitriptyline (ELAVIL) 10 MG tablet Take 1 tablet (10 mg total) by mouth at bedtime as needed for sleep. Reported on 11/17/2015 (Patient taking differently: Take 5 mg by mouth at bedtime as needed for sleep. Reported on 11/17/2015) 30 tablet 5  . amLODipine (NORVASC) 10 MG tablet Take 1 tablet (10 mg total) by mouth daily. 30 tablet 3  . azelastine (ASTELIN) 0.1 % nasal spray Place 2 sprays into both nostrils 2 (two) times daily. Use in each nostril as directed 30 mL 12  . cetirizine (ZYRTEC) 10 MG tablet Take 10 mg by mouth daily.    Marland Kitchen EPIPEN 2-PAK 0.3 MG/0.3ML SOAJ injection     . fluticasone (FLONASE) 50 MCG/ACT nasal spray PLACE 2 SPRAYS INTO BOTH NOSTRILS DAILY. 48 g 2  . fluvoxaMINE (LUVOX) 50  MG tablet Take 50 mg by mouth at bedtime.    . lamoTRIgine (LAMICTAL) 200 MG tablet Take 200 mg by mouth daily.     . montelukast (SINGULAIR) 10 MG tablet     . Multiple Vitamin (MULTIVITAMIN) tablet Take 1 tablet by mouth daily.     No current facility-administered medications on file prior to visit.   No Known Allergies Social History   Social History  . Marital Status: Married    Spouse Name: N/A  . Number of Children: N/A  . Years of Education: N/A   Occupational History  .  Not on file.   Social History Main Topics  . Smoking status: Never Smoker   . Smokeless tobacco: Never Used  . Alcohol Use: No  . Drug Use: No  . Sexual Activity: Not on file   Other Topics Concern  . Not on file   Social History Narrative      Review of Systems  All other systems reviewed and are negative.      Objective:   Physical Exam  Constitutional: He appears well-developed and well-nourished. No distress.  Neck: Neck supple. No thyromegaly present.  Cardiovascular: Normal rate, regular rhythm and normal heart sounds.   Pulmonary/Chest: Effort normal and breath sounds normal.  Abdominal: Soft. Bowel sounds are normal.  Lymphadenopathy:    He has no cervical adenopathy.  Skin: He is not diaphoretic.  Vitals reviewed.         Assessment & Plan:  Chronic fatigue - Plan: CBC with Differential/Platelet, TSH, Testosterone  CKD (chronic kidney disease), stage I  Elevated blood pressure (not hypertension)  Patient's blood pressure was elevated last office visit. Since that time I have recommended amlodipine 10 mg by mouth daily. His liver function tests have normalized. Lab work showed no evidence of recent EBV infection. Therefore he would like to proceed with further workup for fatigue including a CBC, TSH, and a testosterone level. We had a long discussion today about possibly being psychosomatic related to his anxiety. Unfortunately his renal function continues to worsen. His creatinine is down to 1.5. Although mild, I have no underlying cause for this. I recommended a renal ultrasound

## 2016-05-08 LAB — TESTOSTERONE: Testosterone: 289 ng/dL (ref 250–827)

## 2016-05-08 LAB — CBC WITH DIFFERENTIAL/PLATELET
BASOS ABS: 0 {cells}/uL (ref 0–200)
Basophils Relative: 0 %
EOS ABS: 49 {cells}/uL (ref 15–500)
EOS PCT: 1 %
HCT: 46.4 % (ref 38.5–50.0)
HEMOGLOBIN: 16.1 g/dL (ref 13.0–17.0)
LYMPHS ABS: 1519 {cells}/uL (ref 850–3900)
Lymphocytes Relative: 31 %
MCH: 29.5 pg (ref 27.0–33.0)
MCHC: 34.7 g/dL (ref 32.0–36.0)
MCV: 85.1 fL (ref 80.0–100.0)
MONOS PCT: 6 %
MPV: 9 fL (ref 7.5–12.5)
Monocytes Absolute: 294 cells/uL (ref 200–950)
NEUTROS PCT: 62 %
Neutro Abs: 3038 cells/uL (ref 1500–7800)
Platelets: 343 10*3/uL (ref 140–400)
RBC: 5.45 MIL/uL (ref 4.20–5.80)
RDW: 12.6 % (ref 11.0–15.0)
WBC: 4.9 10*3/uL (ref 3.8–10.8)

## 2016-05-08 LAB — TSH: TSH: 1.03 m[IU]/L (ref 0.40–4.50)

## 2016-05-13 ENCOUNTER — Other Ambulatory Visit: Payer: BC Managed Care – PPO

## 2016-05-15 ENCOUNTER — Other Ambulatory Visit: Payer: BC Managed Care – PPO

## 2016-05-17 ENCOUNTER — Ambulatory Visit
Admission: RE | Admit: 2016-05-17 | Discharge: 2016-05-17 | Disposition: A | Discharge status: Home / Self Care | Payer: BC Managed Care – PPO | Source: Ambulatory Visit | Attending: Family Medicine | Admitting: Family Medicine

## 2016-05-17 DIAGNOSIS — R944 Abnormal results of kidney function studies: Secondary | ICD-10-CM

## 2016-05-29 ENCOUNTER — Encounter: Payer: Self-pay | Admitting: Family Medicine

## 2016-06-13 ENCOUNTER — Telehealth: Payer: Self-pay | Admitting: Family Medicine

## 2016-06-13 NOTE — Telephone Encounter (Signed)
Needs note or letter as to when or if able to return to work. Full duty or restricted duty.  Pt is Recruitment consultant.  Needs to have by Monday by next week.

## 2016-06-18 ENCOUNTER — Encounter: Payer: Self-pay | Admitting: Family Medicine

## 2016-06-18 NOTE — Telephone Encounter (Signed)
Letter is on my desk

## 2016-06-18 NOTE — Telephone Encounter (Signed)
Spoke to wife.  They will come pick up letter.

## 2016-06-27 ENCOUNTER — Other Ambulatory Visit: Payer: Self-pay | Admitting: Family Medicine

## 2016-08-13 ENCOUNTER — Encounter: Payer: Self-pay | Admitting: Family Medicine

## 2016-08-13 ENCOUNTER — Ambulatory Visit (INDEPENDENT_AMBULATORY_CARE_PROVIDER_SITE_OTHER): Payer: BC Managed Care – PPO | Admitting: Family Medicine

## 2016-08-13 VITALS — BP 132/90 | HR 82 | Temp 97.8°F | Resp 14 | Ht 67.0 in | Wt 191.0 lb

## 2016-08-13 DIAGNOSIS — Z0000 Encounter for general adult medical examination without abnormal findings: Secondary | ICD-10-CM

## 2016-08-13 LAB — CBC WITH DIFFERENTIAL/PLATELET
BASOS ABS: 0 {cells}/uL (ref 0–200)
Basophils Relative: 0 %
EOS ABS: 74 {cells}/uL (ref 15–500)
Eosinophils Relative: 2 %
HEMATOCRIT: 42.8 % (ref 38.5–50.0)
Hemoglobin: 14.6 g/dL (ref 13.0–17.0)
LYMPHS PCT: 38 %
Lymphs Abs: 1406 cells/uL (ref 850–3900)
MCH: 29 pg (ref 27.0–33.0)
MCHC: 34.1 g/dL (ref 32.0–36.0)
MCV: 84.9 fL (ref 80.0–100.0)
MONO ABS: 296 {cells}/uL (ref 200–950)
MONOS PCT: 8 %
MPV: 9.3 fL (ref 7.5–12.5)
NEUTROS PCT: 52 %
Neutro Abs: 1924 cells/uL (ref 1500–7800)
PLATELETS: 357 10*3/uL (ref 140–400)
RBC: 5.04 MIL/uL (ref 4.20–5.80)
RDW: 12.6 % (ref 11.0–15.0)
WBC: 3.7 10*3/uL — ABNORMAL LOW (ref 3.8–10.8)

## 2016-08-13 LAB — COMPLETE METABOLIC PANEL WITH GFR
ALT: 26 U/L (ref 9–46)
AST: 26 U/L (ref 10–40)
Albumin: 4.2 g/dL (ref 3.6–5.1)
Alkaline Phosphatase: 88 U/L (ref 40–115)
BILIRUBIN TOTAL: 0.4 mg/dL (ref 0.2–1.2)
BUN: 14 mg/dL (ref 7–25)
CALCIUM: 9.7 mg/dL (ref 8.6–10.3)
CHLORIDE: 102 mmol/L (ref 98–110)
CO2: 29 mmol/L (ref 20–31)
CREATININE: 1.38 mg/dL — AB (ref 0.60–1.35)
GFR, EST AFRICAN AMERICAN: 70 mL/min (ref >=60)
GFR, EST NON AFRICAN AMERICAN: 61 mL/min (ref >=60)
Glucose, Bld: 98 mg/dL (ref 70–99)
Potassium: 4.2 mmol/L (ref 3.5–5.3)
Sodium: 140 mmol/L (ref 135–146)
Total Protein: 6.9 g/dL (ref 6.1–8.1)

## 2016-08-13 LAB — LIPID PANEL
CHOLESTEROL: 154 mg/dL (ref 125–200)
HDL: 60 mg/dL (ref >=40)
LDL CALC: 76 mg/dL (ref <130)
TRIGLYCERIDES: 88 mg/dL (ref <150)
Total CHOL/HDL Ratio: 2.6 Ratio (ref <=5.0)
VLDL: 18 mg/dL (ref <30)

## 2016-08-13 LAB — PSA: PSA: 0.3 ng/mL (ref <=4.0)

## 2016-08-13 NOTE — Progress Notes (Signed)
Subjective:    Patient ID: Dustin Solis, male    DOB: 1971/03/10, 45 y.o.   MRN: [id number]  HPI Patient is here today for complete physical exam. He denies any concerns. He is seeing a psychiatrist and they recently increased his Zyprexa to 10 mg daily for depression and discontinued his Luvox. He is also on amitriptyline along with Lamictal. Unfortunately he has not seen any benefit regarding his depression or anxiety. He is due today for rectal exam and PSA for prostate cancer screening. He denies any family history of colon cancer and therefore does not require colonoscopy until age 72. He is due for a flu shot but he politely declines this.  His last tetanus shot was in 2013 and is up-to-date. Past medical history is significant for chronic fatigue, chronic kidney disease, hypertension, and elevated liver function test. He is due to follow-up on his lab work Past Medical History:  Diagnosis Date  . CKD (chronic kidney disease), stage I    Past Surgical History:  Procedure Laterality Date  . NASAL SEPTUM SURGERY  1999   Current Outpatient Prescriptions on File Prior to Visit  Medication Sig Dispense Refill  . ALPRAZolam (XANAX) 0.5 MG tablet Take 1 tablet (0.5 mg total) by mouth 3 (three) times daily as needed for anxiety. 30 tablet 0  . amitriptyline (ELAVIL) 10 MG tablet TAKE 1 TABLET (10 MG TOTAL) BY MOUTH AT BEDTIME AS NEEDED FOR SLEEP. REPORTED ON 11/17/2015 30 tablet 2  . amLODipine (NORVASC) 10 MG tablet Take 1 tablet (10 mg total) by mouth daily. 30 tablet 3  . azelastine (ASTELIN) 0.1 % nasal spray Place 2 sprays into both nostrils 2 (two) times daily. Use in each nostril as directed 30 mL 12  . cetirizine (ZYRTEC) 10 MG tablet Take 10 mg by mouth daily.    Marland Kitchen EPIPEN 2-PAK 0.3 MG/0.3ML SOAJ injection     . fluticasone (FLONASE) 50 MCG/ACT nasal spray PLACE 2 SPRAYS INTO BOTH NOSTRILS DAILY. 48 g 2  . lamoTRIgine (LAMICTAL) 200 MG tablet Take 200 mg by mouth daily.     .  montelukast (SINGULAIR) 10 MG tablet     . Multiple Vitamin (MULTIVITAMIN) tablet Take 1 tablet by mouth daily.     No current facility-administered medications on file prior to visit.    No Known Allergies Social History   Social History  . Marital status: Married    Spouse name: N/A  . Number of children: N/A  . Years of education: N/A   Occupational History  . Not on file.   Social History Main Topics  . Smoking status: Never Smoker  . Smokeless tobacco: Never Used  . Alcohol use No  . Drug use: No  . Sexual activity: Not on file   Other Topics Concern  . Not on file   Social History Narrative  . No narrative on file   No family history on file.   Review of Systems  All other systems reviewed and are negative.      Objective:   Physical Exam  Constitutional: He is oriented to person, place, and time. He appears well-developed and well-nourished. No distress.  HENT:  Head: Normocephalic and atraumatic.  Right Ear: External ear normal.  Left Ear: External ear normal.  Nose: Nose normal.  Mouth/Throat: Oropharynx is clear and moist. No oropharyngeal exudate.  Eyes: Conjunctivae and EOM are normal. Pupils are equal, round, and reactive to light. Right eye exhibits no discharge. Left eye exhibits  no discharge. No scleral icterus.  Neck: Normal range of motion. Neck supple. No JVD present. No tracheal deviation present. No thyromegaly present.  Cardiovascular: Normal rate, regular rhythm, normal heart sounds and intact distal pulses.  Exam reveals no gallop and no friction rub.   No murmur heard. Pulmonary/Chest: Effort normal and breath sounds normal. No stridor. No respiratory distress. He has no wheezes. He has no rales. He exhibits no tenderness.  Abdominal: Soft. Bowel sounds are normal. He exhibits no distension. There is no tenderness. There is no rebound and no guarding.  Genitourinary: Rectum normal, prostate normal and penis normal.  Musculoskeletal:  Normal range of motion. He exhibits no edema, tenderness or deformity.  Lymphadenopathy:    He has no cervical adenopathy.  Neurological: He is alert and oriented to person, place, and time. He has normal reflexes. He displays normal reflexes. No cranial nerve deficit. He exhibits normal muscle tone. Coordination normal.  Skin: Skin is warm. No rash noted. He is not diaphoretic. No erythema. No pallor.  Psychiatric: He has a normal mood and affect. His behavior is normal. Judgment and thought content normal.  Vitals reviewed.         Assessment & Plan:  Routine general medical examination at a health care facility - Plan: CBC with Differential/Platelet, COMPLETE METABOLIC PANEL WITH GFR, Lipid panel, PSA  The patient's physical exam today is completely normal. His immunizations are up-to-date. He does not yet require colonoscopy. I will check a PSA. His blood pressure is borderline. He states that however it is doing much better at home. I will check a CBC, CMP, fasting lipid panel, and a PSA. His goal LDL cholesterol is less than 130. I will monitor his chronic kidney disease for any deterioration. Continue to refrain from any NSAIDs. Monitor the patient's liver function test.

## 2016-08-15 ENCOUNTER — Telehealth: Payer: Self-pay | Admitting: *Deleted

## 2016-08-15 NOTE — Telephone Encounter (Signed)
Received forms from patient in regards to short term disability.   MD reviewed and states that she is not treating patient and did not take patient out of work, therefore she cannot complete disability forms.   Call placed to patient and patient made aware per VM.

## 2016-09-01 ENCOUNTER — Other Ambulatory Visit: Payer: Self-pay | Admitting: Family Medicine

## 2016-10-16 ENCOUNTER — Other Ambulatory Visit: Payer: Self-pay | Admitting: Family Medicine

## 2016-10-16 MED ORDER — AMITRIPTYLINE HCL 10 MG PO TABS: 10.0000 mg | ORAL_TABLET | Freq: Every evening | ORAL | 2 refills | Status: DC | PRN

## 2016-12-19 ENCOUNTER — Other Ambulatory Visit: Payer: Self-pay | Admitting: Family Medicine

## 2017-01-15 ENCOUNTER — Other Ambulatory Visit: Payer: Self-pay | Admitting: Family Medicine

## 2017-02-05 ENCOUNTER — Encounter: Payer: Self-pay | Admitting: Family Medicine

## 2017-02-25 ENCOUNTER — Ambulatory Visit: Payer: BC Managed Care – PPO | Admitting: Family Medicine

## 2017-02-27 ENCOUNTER — Ambulatory Visit (INDEPENDENT_AMBULATORY_CARE_PROVIDER_SITE_OTHER): Payer: BC Managed Care – PPO | Admitting: Family Medicine

## 2017-02-27 ENCOUNTER — Encounter: Payer: Self-pay | Admitting: Family Medicine

## 2017-02-27 VITALS — BP 118/70 | HR 68 | Temp 97.9°F | Resp 16 | Ht 67.0 in | Wt 203.0 lb

## 2017-02-27 DIAGNOSIS — I10 Essential (primary) hypertension: Secondary | ICD-10-CM

## 2017-02-27 DIAGNOSIS — N181 Chronic kidney disease, stage 1: Secondary | ICD-10-CM

## 2017-02-27 NOTE — Progress Notes (Signed)
Subjective:    Patient ID: Dustin Solis, male    DOB: October 18, 1971, 46 y.o.   MRN: [id number]  HPI  07/2016 Patient is here today for complete physical exam. He denies any concerns. He is seeing a psychiatrist and they recently increased his Zyprexa to 10 mg daily for depression and discontinued his Luvox. He is also on amitriptyline along with Lamictal. Unfortunately he has not seen any benefit regarding his depression or anxiety. He is due today for rectal exam and PSA for prostate cancer screening. He denies any family history of colon cancer and therefore does not require colonoscopy until age 45. He is due for a flu shot but he politely declines this.  His last tetanus shot was in 2013 and is up-to-date. Past medical history is significant for chronic fatigue, chronic kidney disease, hypertension, and elevated liver function test. He is due to follow-up on his lab work.  At that time, my plan was: The patient's physical exam today is completely normal. His immunizations are up-to-date. He does not yet require colonoscopy. I will check a PSA. His blood pressure is borderline. He states that however it is doing much better at home. I will check a CBC, CMP, fasting lipid panel, and a PSA. His goal LDL cholesterol is less than 130. I will monitor his chronic kidney disease for any deterioration. Continue to refrain from any NSAIDs. Monitor the patient's liver function test.  02/27/17 The patient's blood pressure today is outstanding at 118/70. He denies any chest pain shortness of breath or dyspnea on exertion. He reports a normal urine output. He denies any blood in his urine. He has experienced weight gain which seems to be attributable to the Zyprexa. Recently his psychiatrist switched him to invega, added temazepam, and increased lamictal and stopped zyprexa. The recent medicine changes, the patient's weight is up 23 pounds from approximately 1 year ago Wt Readings from Last 3 Encounters:    02/27/17 203 lb (92.1 kg)  08/13/16 191 lb (86.6 kg)  05/07/16 180 lb (81.6 kg)    Past Medical History:  Diagnosis Date  . CKD (chronic kidney disease), stage I    Past Surgical History:  Procedure Laterality Date  . NASAL SEPTUM SURGERY  1999   Current Outpatient Prescriptions on File Prior to Visit  Medication Sig Dispense Refill  . ALPRAZolam (XANAX) 0.5 MG tablet Take 1 tablet (0.5 mg total) by mouth 3 (three) times daily as needed for anxiety. 30 tablet 0  . amitriptyline (ELAVIL) 10 MG tablet Take 1 tablet (10 mg total) by mouth at bedtime as needed for sleep. Reported on 11/17/2015 90 tablet 2  . amLODipine (NORVASC) 10 MG tablet TAKE 1 TABLET (10 MG TOTAL) BY MOUTH DAILY. 30 tablet 3  . azelastine (ASTELIN) 0.1 % nasal spray PLACE 2 SPRAYS INTO BOTH NOSTRILS 2 (TWO) TIMES DAILY AS DIRECTED 30 mL 4  . cetirizine (ZYRTEC) 10 MG tablet Take 10 mg by mouth daily.    Marland Kitchen EPIPEN 2-PAK 0.3 MG/0.3ML SOAJ injection     . fluticasone (FLONASE) 50 MCG/ACT nasal spray PLACE 2 SPRAYS INTO BOTH NOSTRILS DAILY. 48 g 2  . lamoTRIgine (LAMICTAL) 200 MG tablet Take 200 mg by mouth daily.     . montelukast (SINGULAIR) 10 MG tablet     . Multiple Vitamin (MULTIVITAMIN) tablet Take 1 tablet by mouth daily.    Marland Kitchen OLANZapine (ZYPREXA) 10 MG tablet      No current facility-administered medications on file prior to  visit.    No Known Allergies Social History   Social History  . Marital status: Married    Spouse name: N/A  . Number of children: N/A  . Years of education: N/A   Occupational History  . Not on file.   Social History Main Topics  . Smoking status: Never Smoker  . Smokeless tobacco: Never Used  . Alcohol use No  . Drug use: No  . Sexual activity: Not on file   Other Topics Concern  . Not on file   Social History Narrative  . No narrative on file   No family history on file.   Review of Systems  All other systems reviewed and are negative.      Objective:    Physical Exam  Constitutional: He appears well-developed and well-nourished. No distress.  Neck: Normal range of motion. Neck supple. No JVD present. No thyromegaly present.  Cardiovascular: Normal rate, regular rhythm, normal heart sounds and intact distal pulses.  Exam reveals no gallop and no friction rub.   No murmur heard. Pulmonary/Chest: Effort normal and breath sounds normal. No respiratory distress. He has no wheezes. He has no rales. He exhibits no tenderness.  Abdominal: Soft. Bowel sounds are normal. He exhibits no distension. There is no tenderness. There is no rebound and no guarding.  Musculoskeletal: He exhibits no edema.  Skin: Skin is warm. He is not diaphoretic.  Vitals reviewed.         Assessment & Plan:  CKD (chronic kidney disease), stage I - Plan: CBC with Differential/Platelet, COMPLETE METABOLIC PANEL WITH GFR  Benign essential HTN - Plan: CBC with Differential/Platelet, COMPLETE METABOLIC PANEL WITH GFR  Psychiatrist also recently started clonidine. As a result the patient's blood pressure is very well controlled at 118/70. I would not change his antihypertensive medication any further. I will check a CMP today to monitor his renal function as well as his liver function test given his history of elevated liver function tests in the past. I'll also check a CBC to monitor for anemia. Otherwise continue control his blood pressure, avoid NSAIDs, and try to work on exercise to achieve weight loss

## 2017-02-28 ENCOUNTER — Encounter: Payer: Self-pay | Admitting: Family Medicine

## 2017-02-28 LAB — CBC WITH DIFFERENTIAL/PLATELET
BASOS ABS: 0 {cells}/uL (ref 0–200)
Basophils Relative: 0 %
EOS PCT: 1 %
Eosinophils Absolute: 47 cells/uL (ref 15–500)
HEMATOCRIT: 40.7 % (ref 38.5–50.0)
HEMOGLOBIN: 13.5 g/dL (ref 13.0–17.0)
LYMPHS ABS: 1645 {cells}/uL (ref 850–3900)
LYMPHS PCT: 35 %
MCH: 28 pg (ref 27.0–33.0)
MCHC: 33.2 g/dL (ref 32.0–36.0)
MCV: 84.4 fL (ref 80.0–100.0)
MPV: 8.9 fL (ref 7.5–12.5)
Monocytes Absolute: 329 cells/uL (ref 200–950)
Monocytes Relative: 7 %
NEUTROS PCT: 57 %
Neutro Abs: 2679 cells/uL (ref 1500–7800)
Platelets: 379 10*3/uL (ref 140–400)
RBC: 4.82 MIL/uL (ref 4.20–5.80)
RDW: 13 % (ref 11.0–15.0)
WBC: 4.7 10*3/uL (ref 3.8–10.8)

## 2017-02-28 LAB — COMPLETE METABOLIC PANEL WITH GFR
ALBUMIN: 4.4 g/dL (ref 3.6–5.1)
ALK PHOS: 87 U/L (ref 40–115)
ALT: 22 U/L (ref 9–46)
AST: 23 U/L (ref 10–40)
BUN: 15 mg/dL (ref 7–25)
CALCIUM: 10.1 mg/dL (ref 8.6–10.3)
CO2: 29 mmol/L (ref 20–31)
Chloride: 103 mmol/L (ref 98–110)
Creat: 1.72 mg/dL — ABNORMAL HIGH (ref 0.60–1.35)
GFR, EST NON AFRICAN AMERICAN: 46 mL/min — AB (ref >=60)
GFR, Est African American: 54 mL/min — ABNORMAL LOW (ref >=60)
Glucose, Bld: 96 mg/dL (ref 70–99)
POTASSIUM: 4.3 mmol/L (ref 3.5–5.3)
SODIUM: 139 mmol/L (ref 135–146)
Total Bilirubin: 0.3 mg/dL (ref 0.2–1.2)
Total Protein: 7 g/dL (ref 6.1–8.1)

## 2017-04-03 ENCOUNTER — Ambulatory Visit (INDEPENDENT_AMBULATORY_CARE_PROVIDER_SITE_OTHER): Payer: BC Managed Care – PPO | Admitting: Family Medicine

## 2017-04-03 ENCOUNTER — Encounter: Payer: Self-pay | Admitting: Family Medicine

## 2017-04-03 VITALS — BP 120/74 | HR 84 | Temp 97.6°F | Resp 16 | Wt 205.0 lb

## 2017-04-03 DIAGNOSIS — N179 Acute kidney failure, unspecified: Secondary | ICD-10-CM

## 2017-04-03 NOTE — Progress Notes (Signed)
Subjective:    Patient ID: Dustin Solis, male    DOB: [birth year], 46 y.o.   MRN: [id number]  Medication Refill     07/2016 Patient is here today for complete physical exam. He denies any concerns. He is seeing a psychiatrist and they recently increased his Zyprexa to 10 mg daily for depression and discontinued his Luvox. He is also on amitriptyline along with Lamictal. Unfortunately he has not seen any benefit regarding his depression or anxiety. He is due today for rectal exam and PSA for prostate cancer screening. He denies any family history of colon cancer and therefore does not require colonoscopy until age 6. He is due for a flu shot but he politely declines this.  His last tetanus shot was in 2013 and is up-to-date. Past medical history is significant for chronic fatigue, chronic kidney disease, hypertension, and elevated liver function test. He is due to follow-up on his lab work.  At that time, my plan was: The patient's physical exam today is completely normal. His immunizations are up-to-date. He does not yet require colonoscopy. I will check a PSA. His blood pressure is borderline. He states that however it is doing much better at home. I will check a CBC, CMP, fasting lipid panel, and a PSA. His goal LDL cholesterol is less than 130. I will monitor his chronic kidney disease for any deterioration. Continue to refrain from any NSAIDs. Monitor the patient's liver function test.  02/27/17 The patient's blood pressure today is outstanding at 118/70. He denies any chest pain shortness of breath or dyspnea on exertion. He reports a normal urine output. He denies any blood in his urine. He has experienced weight gain which seems to be attributable to the Zyprexa. Recently his psychiatrist switched him to invega, added temazepam, and increased lamictal and stopped zyprexa. The recent medicine changes, the patient's weight is up 23 pounds from approximately 1 year ago Wt Readings from Last 3  Encounters:  02/27/17 203 lb (92.1 kg)  08/13/16 191 lb (86.6 kg)  05/07/16 180 lb (81.6 kg)  At that time, my plan was: Psychiatrist also recently started clonidine. As a result the patient's blood pressure is very well controlled at 118/70. I would not change his antihypertensive medication any further. I will check a CMP today to monitor his renal function as well as his liver function test given his history of elevated liver function tests in the past. I'll also check a CBC to monitor for anemia. Otherwise continue control his blood pressure, avoid NSAIDs, and try to work on exercise to achieve weight loss  04/03/17 GFR had dropped significantly to 54.  Renal US in 7/17 was unremarkable.  Has been drinking more fluid.  Also taking muscle milk protein supplements 3 times a week and working out more.    Past Medical History:  Diagnosis Date  . CKD (chronic kidney disease), stage I    Past Surgical History:  Procedure Laterality Date  . NASAL SEPTUM SURGERY  1999   Current Outpatient Prescriptions on File Prior to Visit  Medication Sig Dispense Refill  . ALPRAZolam (XANAX) 0.5 MG tablet Take 1 tablet (0.5 mg total) by mouth 3 (three) times daily as needed for anxiety. 30 tablet 0  . amitriptyline (ELAVIL) 10 MG tablet Take 1 tablet (10 mg total) by mouth at bedtime as needed for sleep. Reported on 11/17/2015 90 tablet 2  . amLODipine (NORVASC) 10 MG tablet TAKE 1 TABLET (10 MG TOTAL) BY MOUTH DAILY. [place]  tablet 3  . azelastine (ASTELIN) 0.1 % nasal spray PLACE 2 SPRAYS INTO BOTH NOSTRILS 2 (TWO) TIMES DAILY AS DIRECTED 30 mL 4  . cetirizine (ZYRTEC) 10 MG tablet Take 10 mg by mouth daily.    . cloNIDine (CATAPRES) 0.1 MG tablet Take 0.1 mg by mouth 2 (two) times daily.     Marland Kitchen EPIPEN 2-PAK 0.3 MG/0.3ML SOAJ injection     . fluticasone (FLONASE) 50 MCG/ACT nasal spray PLACE 2 SPRAYS INTO BOTH NOSTRILS DAILY. 48 g 2  . lamoTRIgine (LAMICTAL) 150 MG tablet Take 300 mg by mouth daily.     .  montelukast (SINGULAIR) 10 MG tablet     . Multiple Vitamin (MULTIVITAMIN) tablet Take 1 tablet by mouth daily.    . paliperidone (INVEGA) 6 MG 24 hr tablet Take 6 mg by mouth 2 (two) times daily.     . temazepam (RESTORIL) 15 MG capsule Take 15 mg by mouth at bedtime.      No current facility-administered medications on file prior to visit.    No Known Allergies Social History   Social History  . Marital status: Married    Spouse name: N/A  . Number of children: N/A  . Years of education: N/A   Occupational History  . Not on file.   Social History Main Topics  . Smoking status: Never Smoker  . Smokeless tobacco: Never Used  . Alcohol use No  . Drug use: No  . Sexual activity: Not on file   Other Topics Concern  . Not on file   Social History Narrative  . No narrative on file   No family history on file.   Review of Systems  All other systems reviewed and are negative.      Objective:   Physical Exam  Constitutional: He appears well-developed and well-nourished. No distress.  Neck: Normal range of motion. Neck supple. No JVD present. No thyromegaly present.  Cardiovascular: Normal rate, regular rhythm, normal heart sounds and intact distal pulses.  Exam reveals no gallop and no friction rub.   No murmur heard. Pulmonary/Chest: Effort normal and breath sounds normal. No respiratory distress. He has no wheezes. He has no rales. He exhibits no tenderness.  Abdominal: Soft. Bowel sounds are normal. He exhibits no distension. There is no tenderness. There is no rebound and no guarding.  Musculoskeletal: He exhibits no edema.  Skin: Skin is warm. He is not diaphoretic.  Vitals reviewed.         Assessment & Plan:  AKI (acute kidney injury) ([place]) - Plan: BASIC METABOLIC PANEL WITH GFR, Urinalysis, Routine w reflex microscopic  Check bmp and ua.  If renal fcn is worsening, will consult neprology about possible biopsy especially if hematuria or proteinuria is  present.  DC muscle milk supplements.

## 2017-04-04 LAB — URINALYSIS, ROUTINE W REFLEX MICROSCOPIC
BILIRUBIN URINE: NEGATIVE
Glucose, UA: NEGATIVE
KETONES UR: NEGATIVE
Leukocytes, UA: NEGATIVE
Nitrite: NEGATIVE
PROTEIN: NEGATIVE
Specific Gravity, Urine: 1.007 (ref 1.001–1.035)
pH: 7 (ref 5.0–8.0)

## 2017-04-04 LAB — URINALYSIS, MICROSCOPIC ONLY
Bacteria, UA: NONE SEEN [HPF]
Casts: NONE SEEN [LPF]
Crystals: NONE SEEN [HPF]
RBC / HPF: NONE SEEN RBC/HPF (ref <=2)
Squamous Epithelial / LPF: NONE SEEN [HPF] (ref <=5)
WBC UA: NONE SEEN WBC/HPF (ref <=5)
YEAST: NONE SEEN [HPF]

## 2017-04-04 LAB — BASIC METABOLIC PANEL WITH GFR
BUN: 12 mg/dL (ref 7–25)
CALCIUM: 9.9 mg/dL (ref 8.6–10.3)
CO2: 28 mmol/L (ref 20–31)
CREATININE: 1.52 mg/dL — AB (ref 0.60–1.35)
Chloride: 102 mmol/L (ref 98–110)
GFR, EST AFRICAN AMERICAN: 62 mL/min (ref >=60)
GFR, Est Non African American: 54 mL/min — ABNORMAL LOW (ref >=60)
Glucose, Bld: 104 mg/dL — ABNORMAL HIGH (ref 70–99)
POTASSIUM: 4.3 mmol/L (ref 3.5–5.3)
Sodium: 136 mmol/L (ref 135–146)

## 2017-04-16 IMAGING — US US ABDOMEN LIMITED
1 series · 14 of 25 positions shown · non-contrast
Comparison: None.

CLINICAL DATA: Elevated liver function tests

EXAM:
US ABDOMEN LIMITED - RIGHT UPPER QUADRANT

[Series 1: us abdomen limited · 0.20mm/px · 14 of 45 slices shown]
[im 1/45]
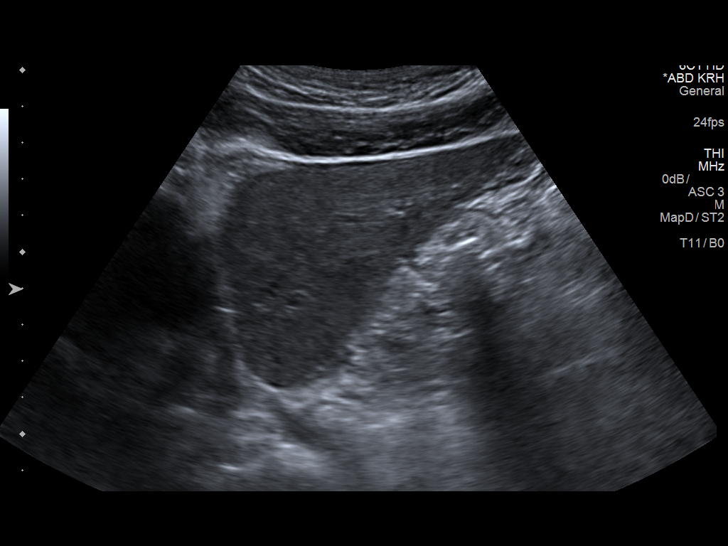
[im 4/45]
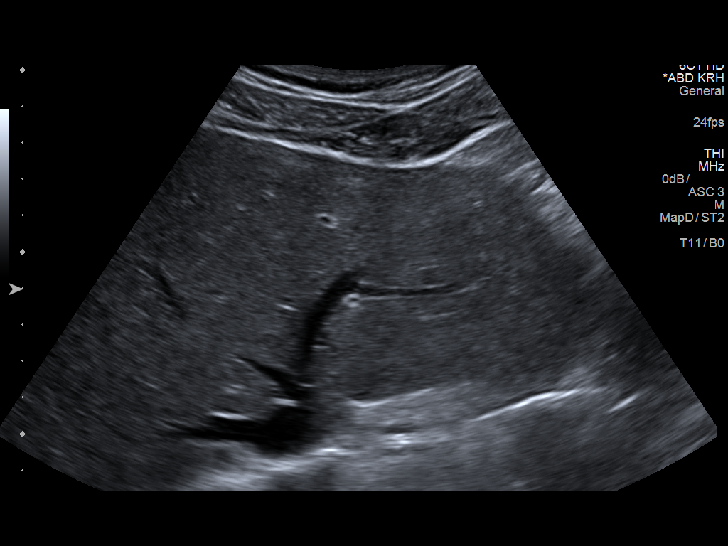
[im 8/45]
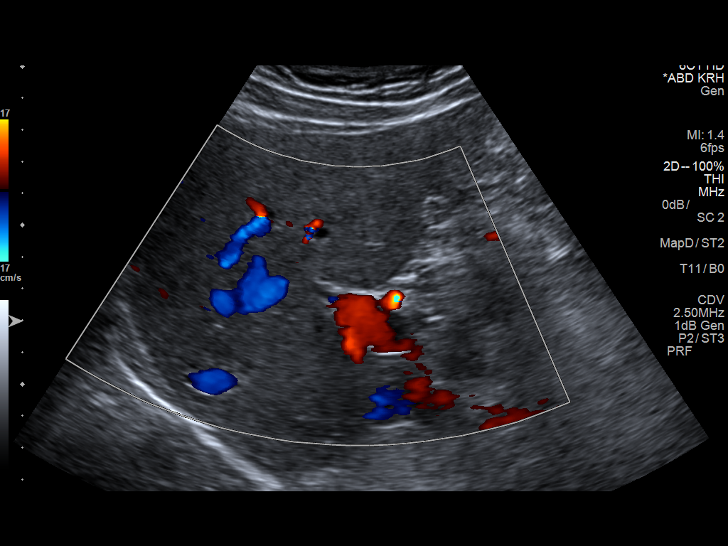
[im 12/45]
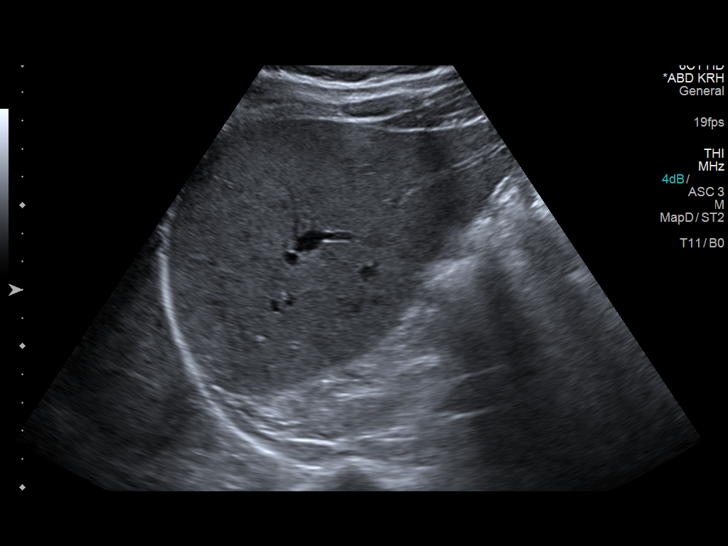
[im 15/45]
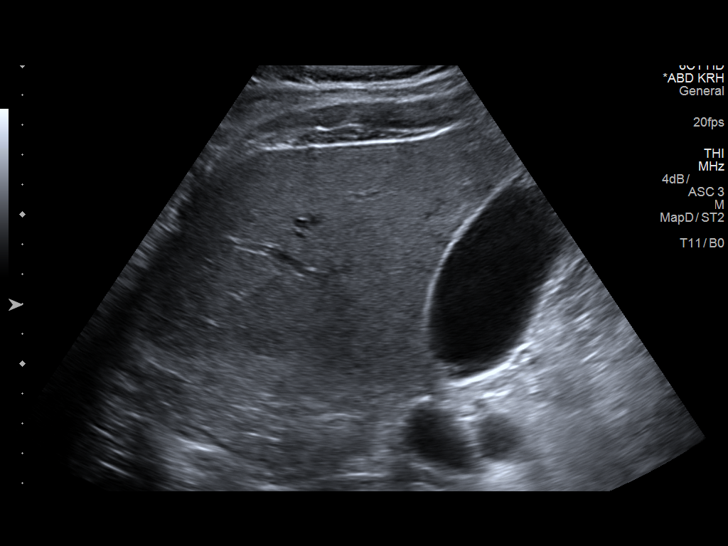
[im 17/45]
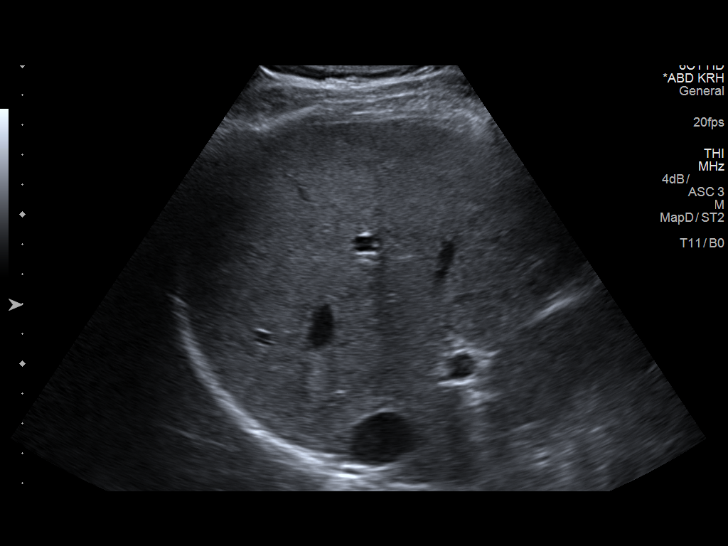
[im 21/45]
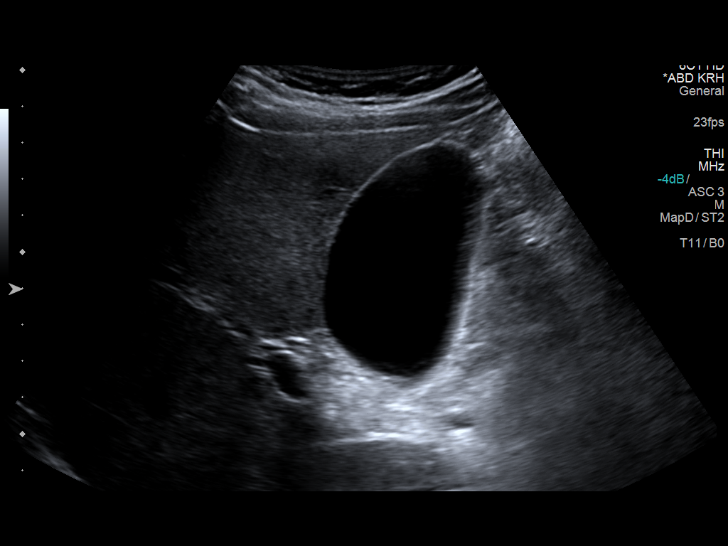
[im 24/45]
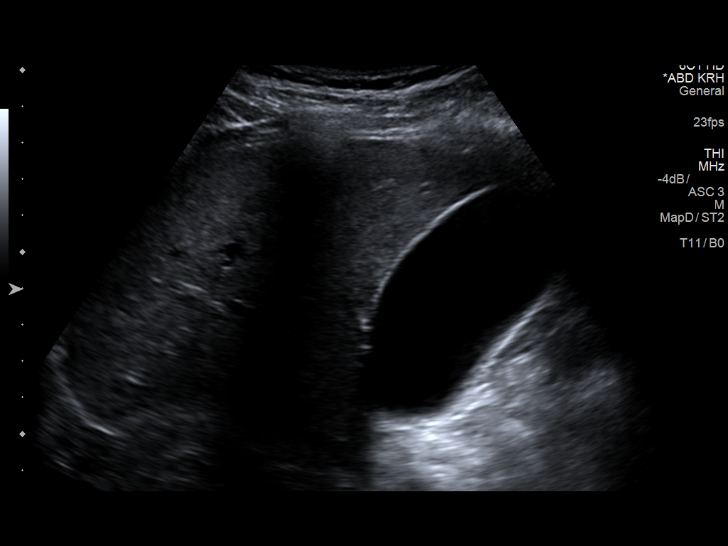
[im 28/45]
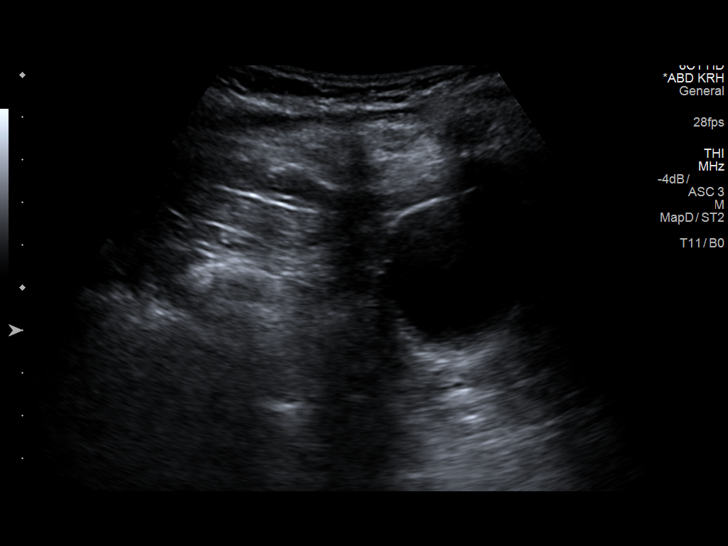
[im 30/45]
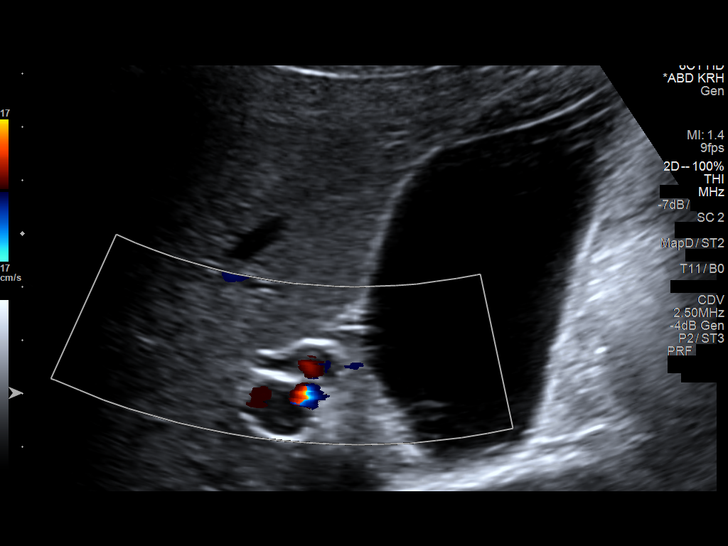
[im 34/45]
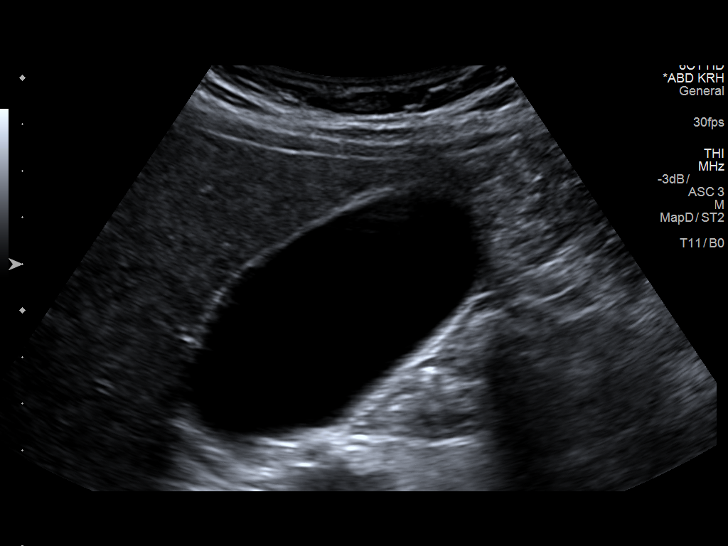
[im 37/45]
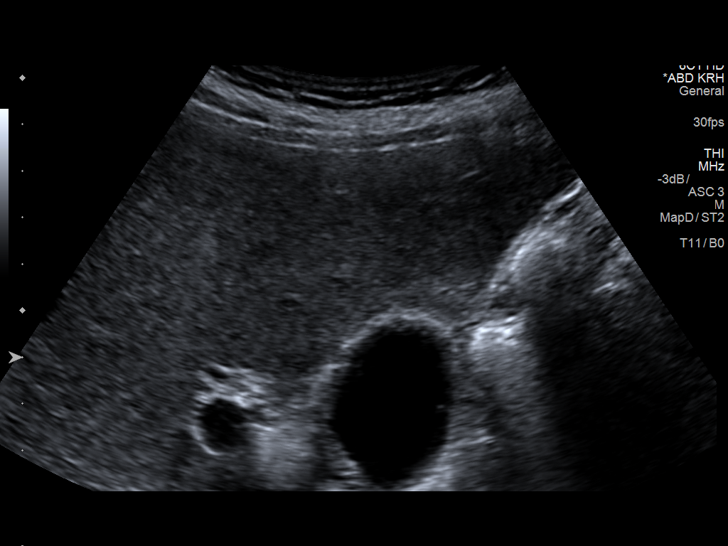
[im 41/45]
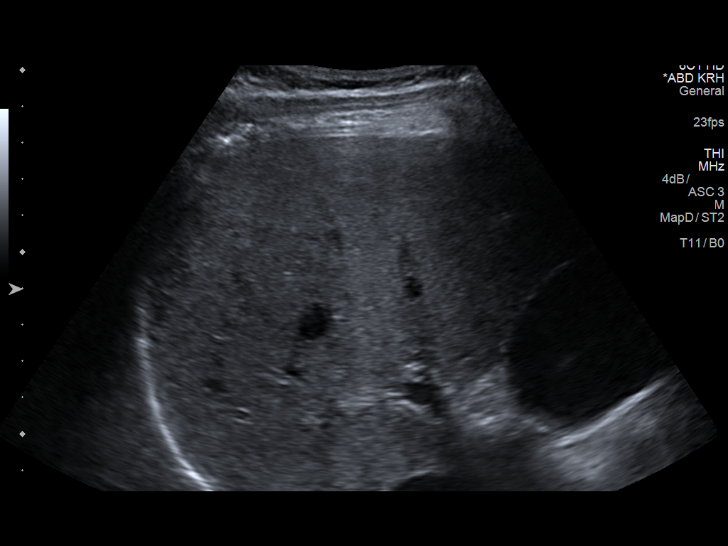
[im 45/45]
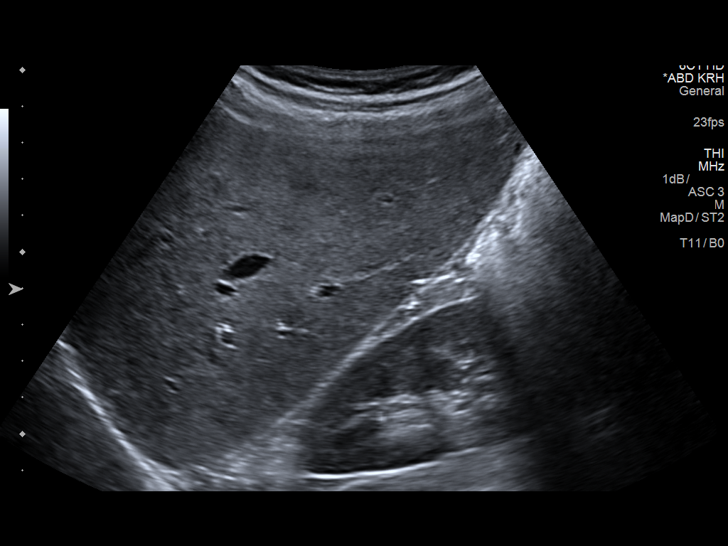

[14 of 25 positions shown; findings below may reference images not displayed]

FINDINGS: Gallbladder:

No gallstones or wall thickening visualized. No sonographic Murphy
sign noted.

Common bile duct:

Diameter: 3 mm

Liver:

No focal lesion identified. Within normal limits in parenchymal
echogenicity.
IMPRESSION: Within normal limits.

## 2017-04-22 ENCOUNTER — Encounter: Payer: Self-pay | Admitting: Family Medicine

## 2017-04-22 ENCOUNTER — Other Ambulatory Visit: Payer: Self-pay | Admitting: Family Medicine

## 2017-04-22 DIAGNOSIS — R8290 Unspecified abnormal findings in urine: Secondary | ICD-10-CM

## 2017-05-02 ENCOUNTER — Encounter: Payer: Self-pay | Admitting: Family Medicine

## 2017-05-02 ENCOUNTER — Ambulatory Visit (INDEPENDENT_AMBULATORY_CARE_PROVIDER_SITE_OTHER): Payer: BC Managed Care – PPO | Admitting: Family Medicine

## 2017-05-02 VITALS — BP 120/90 | HR 68 | Temp 98.2°F | Resp 16 | Ht 67.0 in | Wt 198.0 lb

## 2017-05-02 DIAGNOSIS — R8290 Unspecified abnormal findings in urine: Secondary | ICD-10-CM

## 2017-05-02 DIAGNOSIS — N183 Chronic kidney disease, stage 3 unspecified: Secondary | ICD-10-CM

## 2017-05-02 LAB — URINALYSIS, ROUTINE W REFLEX MICROSCOPIC
Bilirubin Urine: NEGATIVE
GLUCOSE, UA: NEGATIVE
Ketones, ur: NEGATIVE
LEUKOCYTES UA: NEGATIVE
NITRITE: NEGATIVE
PROTEIN: NEGATIVE
Specific Gravity, Urine: 1.02 (ref 1.001–1.035)
pH: 7 (ref 5.0–8.0)

## 2017-05-02 LAB — BASIC METABOLIC PANEL WITH GFR
BUN: 13 mg/dL (ref 7–25)
CO2: 25 mmol/L (ref 20–31)
Calcium: 10.1 mg/dL (ref 8.6–10.3)
Chloride: 103 mmol/L (ref 98–110)
Creat: 1.57 mg/dL — ABNORMAL HIGH (ref 0.60–1.35)
GFR, EST AFRICAN AMERICAN: 60 mL/min (ref >=60)
GFR, EST NON AFRICAN AMERICAN: 52 mL/min — AB (ref >=60)
Glucose, Bld: 97 mg/dL (ref 70–99)
POTASSIUM: 4.3 mmol/L (ref 3.5–5.3)
SODIUM: 138 mmol/L (ref 135–146)

## 2017-05-02 LAB — URINALYSIS, MICROSCOPIC ONLY
BACTERIA UA: NONE SEEN [HPF]
CRYSTALS: NONE SEEN [HPF]
Casts: NONE SEEN [LPF]
Squamous Epithelial / LPF: NONE SEEN [HPF] (ref <=5)
WBC UA: NONE SEEN WBC/HPF (ref <=5)
Yeast: NONE SEEN [HPF]

## 2017-05-02 NOTE — Progress Notes (Signed)
Subjective:    Patient ID: Dustin Solis, male    DOB: 09-20-1971, 46 y.o.   MRN: [id number]  Medication Refill     07/2016 Patient is here today for complete physical exam. He denies any concerns. He is seeing a psychiatrist and they recently increased his Zyprexa to 10 mg daily for depression and discontinued his Luvox. He is also on amitriptyline along with Lamictal. Unfortunately he has not seen any benefit regarding his depression or anxiety. He is due today for rectal exam and PSA for prostate cancer screening. He denies any family history of colon cancer and therefore does not require colonoscopy until age 75. He is due for a flu shot but he politely declines this.  His last tetanus shot was in 2013 and is up-to-date. Past medical history is significant for chronic fatigue, chronic kidney disease, hypertension, and elevated liver function test. He is due to follow-up on his lab work.  At that time, my plan was: The patient's physical exam today is completely normal. His immunizations are up-to-date. He does not yet require colonoscopy. I will check a PSA. His blood pressure is borderline. He states that however it is doing much better at home. I will check a CBC, CMP, fasting lipid panel, and a PSA. His goal LDL cholesterol is less than 130. I will monitor his chronic kidney disease for any deterioration. Continue to refrain from any NSAIDs. Monitor the patient's liver function test.  02/27/17 The patient's blood pressure today is outstanding at 118/70. He denies any chest pain shortness of breath or dyspnea on exertion. He reports a normal urine output. He denies any blood in his urine. He has experienced weight gain which seems to be attributable to the Zyprexa. Recently his psychiatrist switched him to invega, added temazepam, and increased lamictal and stopped zyprexa. The recent medicine changes, the patient's weight is up 23 pounds from approximately 1 year ago Wt Readings from Last 3  Encounters:  05/02/17 198 lb (89.8 kg)  04/03/17 205 lb (93 kg)  02/27/17 203 lb (92.1 kg)  At that time, my plan was: Psychiatrist also recently started clonidine. As a result the patient's blood pressure is very well controlled at 118/70. I would not change his antihypertensive medication any further. I will check a CMP today to monitor his renal function as well as his liver function test given his history of elevated liver function tests in the past. I'll also check a CBC to monitor for anemia. Otherwise continue control his blood pressure, avoid NSAIDs, and try to work on exercise to achieve weight loss  04/03/17 GFR had dropped significantly to 54.  Renal US in 7/17 was unremarkable.  Has been drinking more fluid.  Also taking muscle milk protein supplements 3 times a week and working out more.  At that time, my plan was: Check bmp and ua.  If renal fcn is worsening, will consult neprology about possible biopsy especially if hematuria or proteinuria is present.  DC muscle milk supplements.  05/02/17 At that visit, the patient's urinalysis was significant for +3 hemoglobin however on microscopic exam, there were no red blood cells leading me to be concerned that it was actually myoglobin detected by the urinalysis related to his weightlifting and muscle supplements that he was taking. Fortunately his creatinine was actually improved from 1.7 down to 1.5 to. He is here today to recheck a urinalysis to ensure resolution of the +3 hemoglobin and monitor his renal function    Past  Medical History:  Diagnosis Date  . CKD (chronic kidney disease), stage I    Past Surgical History:  Procedure Laterality Date  . NASAL SEPTUM SURGERY  1999   Current Outpatient Prescriptions on File Prior to Visit  Medication Sig Dispense Refill  . ALPRAZolam (XANAX) 0.5 MG tablet Take 1 tablet (0.5 mg total) by mouth 3 (three) times daily as needed for anxiety. 30 tablet 0  . amLODipine (NORVASC) 10 MG tablet TAKE 1  TABLET (10 MG TOTAL) BY MOUTH DAILY. 30 tablet 3  . azelastine (ASTELIN) 0.1 % nasal spray PLACE 2 SPRAYS INTO BOTH NOSTRILS 2 (TWO) TIMES DAILY AS DIRECTED 30 mL 4  . cetirizine (ZYRTEC) 10 MG tablet Take 10 mg by mouth daily.    . cloNIDine (CATAPRES) 0.1 MG tablet Take 0.1 mg by mouth 2 (two) times daily.     Marland Kitchen EPIPEN 2-PAK 0.3 MG/0.3ML SOAJ injection     . fluticasone (FLONASE) 50 MCG/ACT nasal spray PLACE 2 SPRAYS INTO BOTH NOSTRILS DAILY. 48 g 2  . lamoTRIgine (LAMICTAL) 150 MG tablet Take 300 mg by mouth daily.     . montelukast (SINGULAIR) 10 MG tablet     . Multiple Vitamin (MULTIVITAMIN) tablet Take 1 tablet by mouth daily.    . paliperidone (INVEGA) 6 MG 24 hr tablet Take 6 mg by mouth 2 (two) times daily.      No current facility-administered medications on file prior to visit.    No Known Allergies Social History   Social History  . Marital status: Married    Spouse name: N/A  . Number of children: N/A  . Years of education: N/A   Occupational History  . Not on file.   Social History Main Topics  . Smoking status: Never Smoker  . Smokeless tobacco: Never Used  . Alcohol use No  . Drug use: No  . Sexual activity: Not on file   Other Topics Concern  . Not on file   Social History Narrative  . No narrative on file   No family history on file.   Review of Systems  All other systems reviewed and are negative.      Objective:   Physical Exam  Constitutional: He appears well-developed and well-nourished. No distress.  Neck: Normal range of motion. Neck supple. No JVD present. No thyromegaly present.  Cardiovascular: Normal rate, regular rhythm, normal heart sounds and intact distal pulses.  Exam reveals no gallop and no friction rub.   No murmur heard. Pulmonary/Chest: Effort normal and breath sounds normal. No respiratory distress. He has no wheezes. He has no rales. He exhibits no tenderness.  Abdominal: Soft. Bowel sounds are normal. He exhibits no  distension. There is no tenderness. There is no rebound and no guarding.  Musculoskeletal: He exhibits no edema.  Skin: Skin is warm. He is not diaphoretic.  Vitals reviewed.         Assessment & Plan:  CKD (chronic kidney disease) stage 3, GFR 30-59 ml/min - Plan: BASIC METABOLIC PANEL WITH GFR, Urinalysis, Routine w reflex microscopic  Urine abnormality - Plan: Urinalysis, Routine w reflex microscopic He has lost some weight since his last visit however he is doing this by eating a healthy diet with natural proteins and exercising by walking or jogging. He is no lifting weights or taking any protein muscle builder supplements. Hopefully the myoglobinuria will have resolved and his renal function will be improved or at least stable.

## 2017-05-05 ENCOUNTER — Encounter: Payer: Self-pay | Admitting: Family Medicine

## 2017-05-16 ENCOUNTER — Other Ambulatory Visit: Payer: Self-pay | Admitting: Family Medicine

## 2017-06-23 IMAGING — US US RENAL
1 series · 14 of 25 positions shown · non-contrast
Comparison: None.

CLINICAL DATA: Chronic renal disease.

EXAM:
RENAL / URINARY TRACT ULTRASOUND COMPLETE

[Series 1: us renal · 0.22mm/px · 14 of 27 slices shown]
[im 1/27]
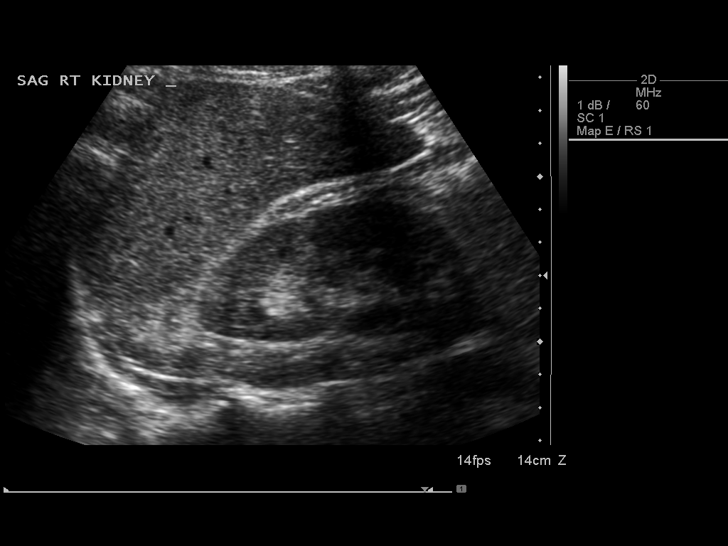
[im 3/27]
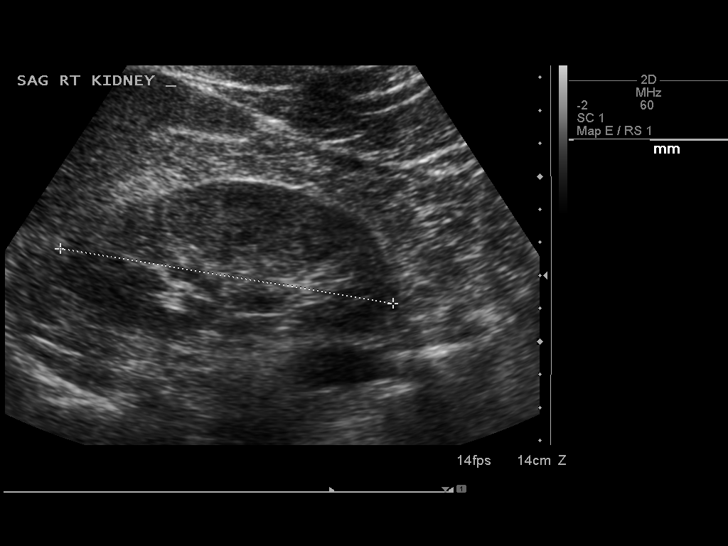
[im 5/27]
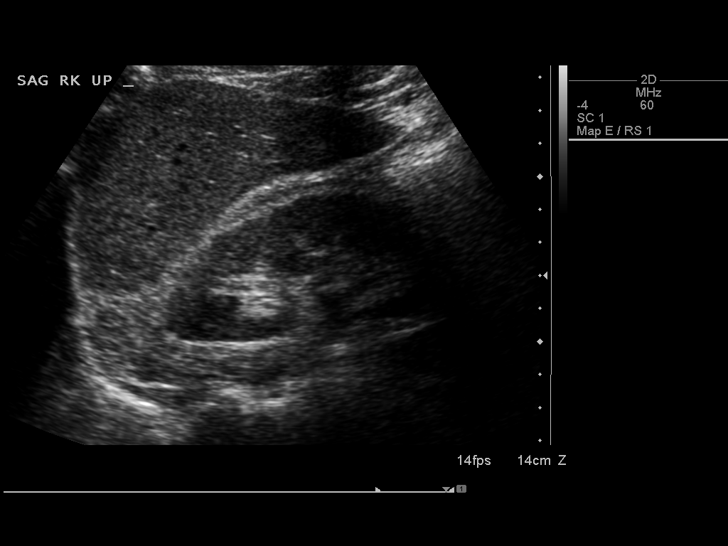
[im 7/27]
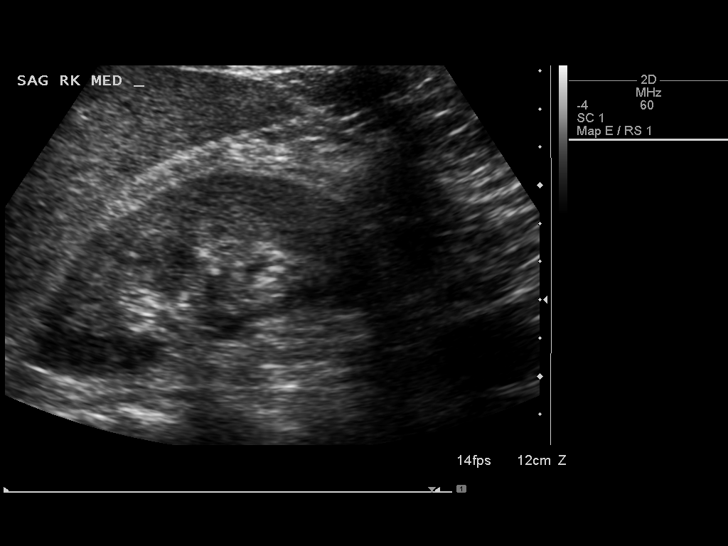
[im 9/27]
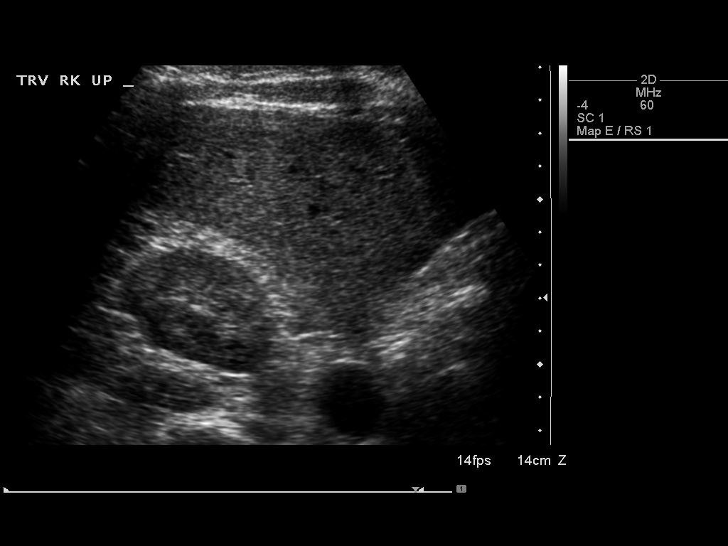
[im 10/27]
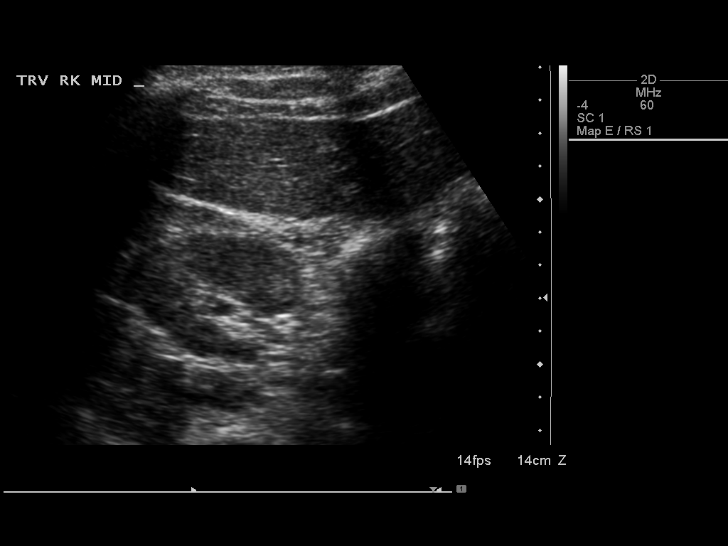
[im 12/27]
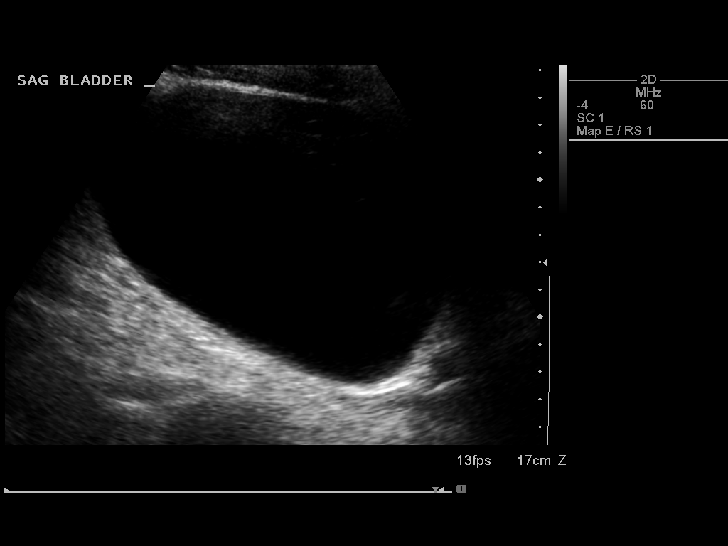
[im 15/27]
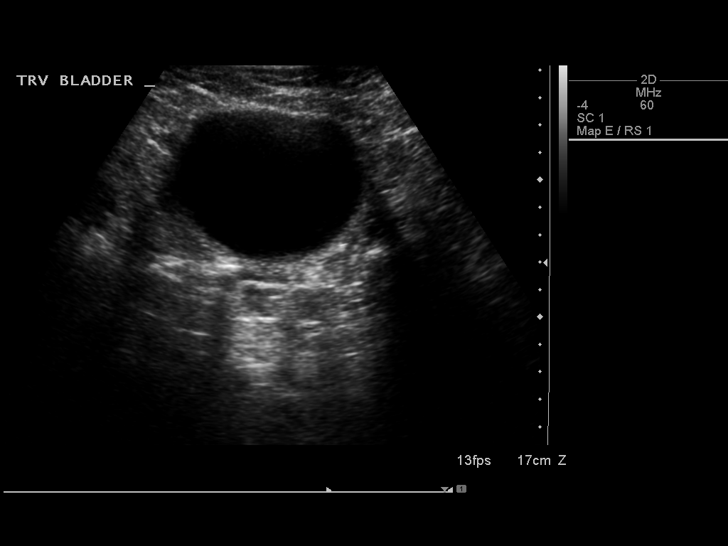
[im 17/27]
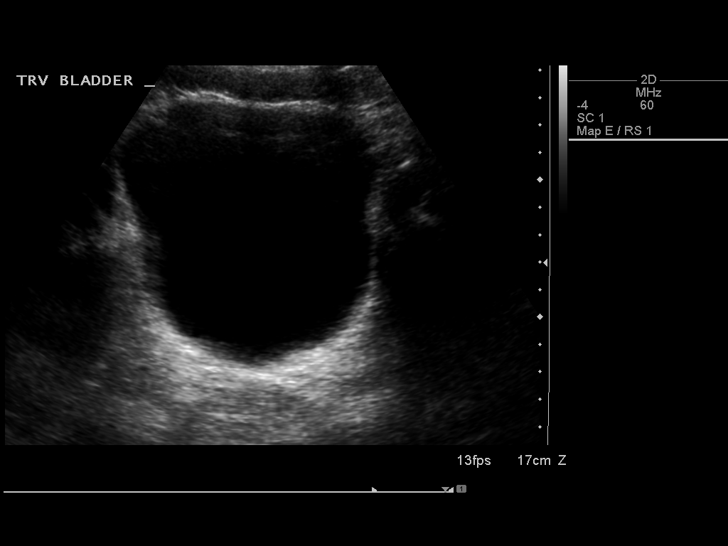
[im 18/27]
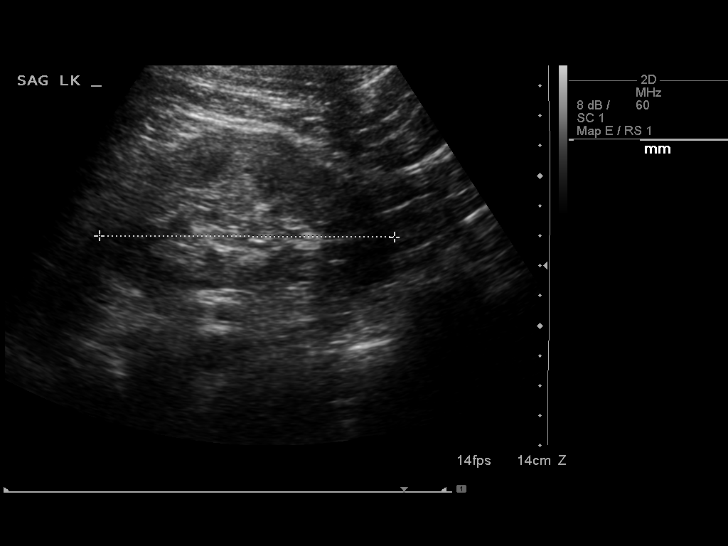
[im 20/27]
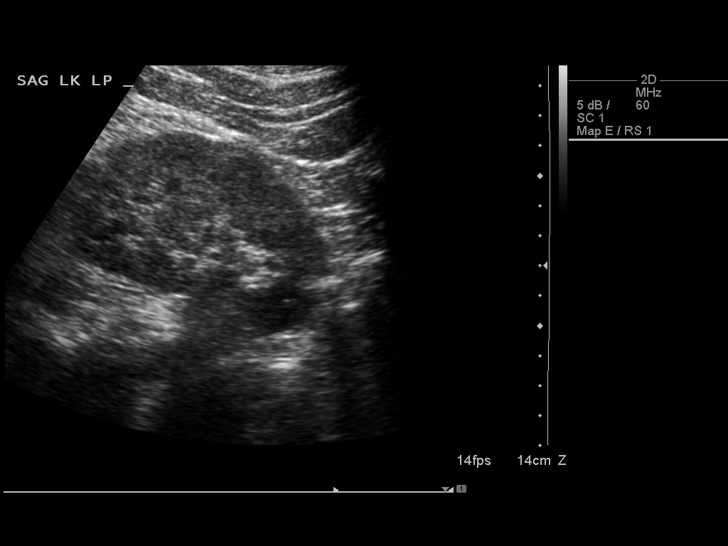
[im 22/27]
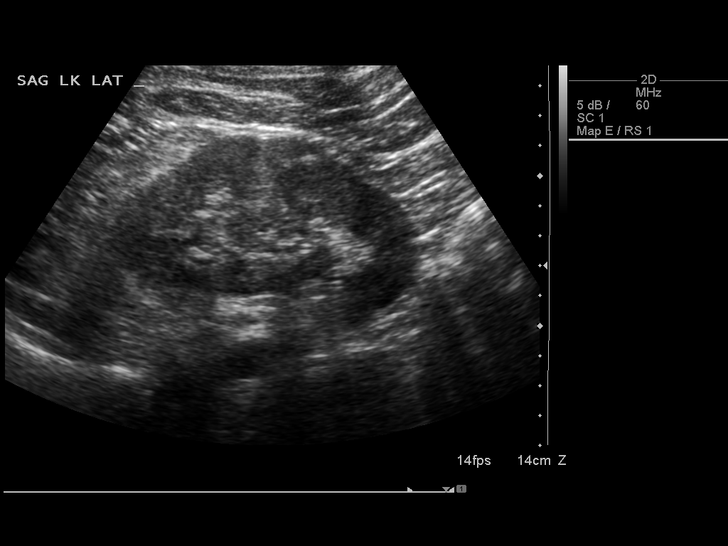
[im 24/27]
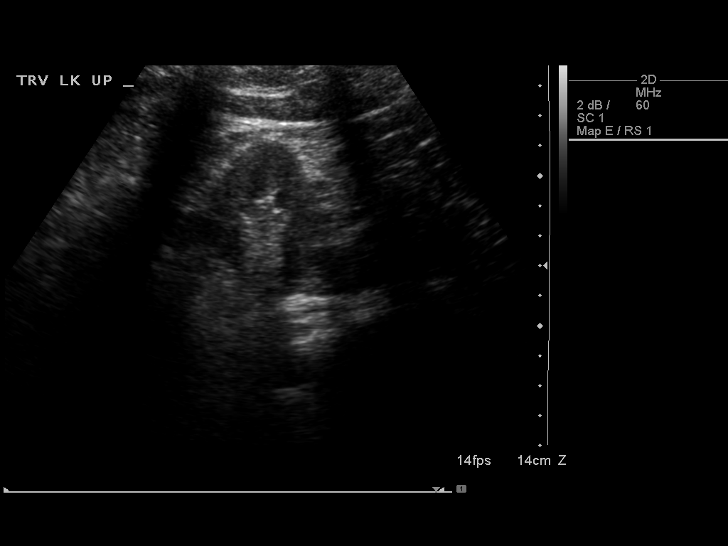
[im 27/27]
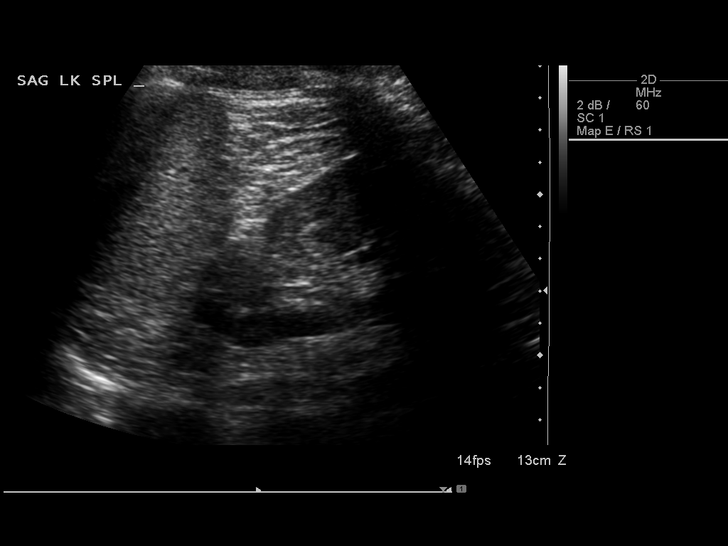

[14 of 25 positions shown; findings below may reference images not displayed]

FINDINGS: Right Kidney:

Length: 10.2 cm. Echogenicity within normal limits. No mass or
hydronephrosis visualized.

Left Kidney:

Length: 9.8 cm. Echogenicity within normal limits. No mass or
hydronephrosis visualized.

Bladder:

Appears normal for degree of bladder distention.
IMPRESSION: Negative exam.  No hydronephrosis or focal abnormality identified.

## 2017-07-24 ENCOUNTER — Encounter: Payer: Self-pay | Admitting: Family Medicine

## 2017-07-24 ENCOUNTER — Ambulatory Visit (INDEPENDENT_AMBULATORY_CARE_PROVIDER_SITE_OTHER): Payer: BC Managed Care – PPO | Admitting: Family Medicine

## 2017-07-24 VITALS — BP 130/70 | HR 96 | Temp 97.9°F | Resp 14 | Ht 67.0 in | Wt 194.0 lb

## 2017-07-24 DIAGNOSIS — M7551 Bursitis of right shoulder: Secondary | ICD-10-CM

## 2017-07-24 NOTE — Progress Notes (Signed)
   Subjective:    Patient ID: Dustin Solis, male    DOB: 03-25-1971, 46 y.o.   MRN: [id number]  HPI  Patient presents with a one-month history of shoulder pain in his right shoulder. It hurts to sleep on his right shoulder at night. He will frequently wake up with soreness underneath the acromial process and after rollover to his other side for comfort. He also reports pain with abduction greater than 90. He has a positive Hawkins sign today. He has a positive empty can test. He has pain with abduction greater than 90. Pain is exacerbated by resistance. He has no palpable crepitus. He cannot tolerate NSAIDs due to chronic kidney disease Past Medical History:  Diagnosis Date  . CKD (chronic kidney disease), stage I    Past Surgical History:  Procedure Laterality Date  . NASAL SEPTUM SURGERY  1999   Current Outpatient Prescriptions on File Prior to Visit  Medication Sig Dispense Refill  . ALPRAZolam (XANAX) 0.5 MG tablet Take 1 tablet (0.5 mg total) by mouth 3 (three) times daily as needed for anxiety. 30 tablet 0  . amLODipine (NORVASC) 10 MG tablet TAKE 1 TABLET (10 MG TOTAL) BY MOUTH DAILY. 30 tablet 3  . azelastine (ASTELIN) 0.1 % nasal spray PLACE 2 SPRAYS INTO BOTH NOSTRILS 2 (TWO) TIMES DAILY AS DIRECTED 30 mL 4  . cetirizine (ZYRTEC) 10 MG tablet Take 10 mg by mouth daily.    Marland Kitchen EPIPEN 2-PAK 0.3 MG/0.3ML SOAJ injection     . fluticasone (FLONASE) 50 MCG/ACT nasal spray PLACE 2 SPRAYS INTO BOTH NOSTRILS DAILY. 48 g 2  . lamoTRIgine (LAMICTAL) 150 MG tablet Take 300 mg by mouth daily.     . montelukast (SINGULAIR) 10 MG tablet     . Multiple Vitamin (MULTIVITAMIN) tablet Take 1 tablet by mouth daily.    . paliperidone (INVEGA) 6 MG 24 hr tablet Take 6 mg by mouth 2 (two) times daily.      No current facility-administered medications on file prior to visit.    No Known Allergies Social History   Social History  . Marital status: Married    Spouse name: N/A  . Number of  children: N/A  . Years of education: N/A   Occupational History  . Not on file.   Social History Main Topics  . Smoking status: Never Smoker  . Smokeless tobacco: Never Used  . Alcohol use No  . Drug use: No  . Sexual activity: Not on file   Other Topics Concern  . Not on file   Social History Narrative  . No narrative on file     Review of Systems  All other systems reviewed and are negative.      Objective:   Physical Exam  Cardiovascular: Normal rate, regular rhythm and normal heart sounds.   Pulmonary/Chest: Effort normal and breath sounds normal.  Musculoskeletal:       Right shoulder: He exhibits decreased range of motion, tenderness and pain. He exhibits no crepitus and normal strength.  Vitals reviewed.         Assessment & Plan:  Subacromial bursitis  Patient appears to have subacromial bursitis in the right shoulder. After obtaining informed consent, I injected the right shoulder with 2 mL of lidocaine, 2 mL of Marcaine, and 2 mL of 40 mg per mL Kenalog. Patient tolerated the procedure well without complication.

## 2017-08-01 ENCOUNTER — Encounter: Payer: Self-pay | Admitting: Family Medicine

## 2017-08-01 ENCOUNTER — Ambulatory Visit (INDEPENDENT_AMBULATORY_CARE_PROVIDER_SITE_OTHER): Payer: BC Managed Care – PPO | Admitting: Family Medicine

## 2017-08-01 VITALS — BP 140/92 | HR 100 | Temp 97.9°F | Resp 16 | Ht 67.0 in | Wt 198.0 lb

## 2017-08-01 DIAGNOSIS — N183 Chronic kidney disease, stage 3 unspecified: Secondary | ICD-10-CM

## 2017-08-01 NOTE — Progress Notes (Signed)
Subjective:    Patient ID: Dustin Solis, male    DOB: 10-04-1971, 46 y.o.   MRN: [id number]  Medication Refill     07/2016 Patient is here today for complete physical exam. He denies any concerns. He is seeing a psychiatrist and they recently increased his Zyprexa to 10 mg daily for depression and discontinued his Luvox. He is also on amitriptyline along with Lamictal. Unfortunately he has not seen any benefit regarding his depression or anxiety. He is due today for rectal exam and PSA for prostate cancer screening. He denies any family history of colon cancer and therefore does not require colonoscopy until age 71. He is due for a flu shot but he politely declines this.  His last tetanus shot was in 2013 and is up-to-date. Past medical history is significant for chronic fatigue, chronic kidney disease, hypertension, and elevated liver function test. He is due to follow-up on his lab work.  At that time, my plan was: The patient's physical exam today is completely normal. His immunizations are up-to-date. He does not yet require colonoscopy. I will check a PSA. His blood pressure is borderline. He states that however it is doing much better at home. I will check a CBC, CMP, fasting lipid panel, and a PSA. His goal LDL cholesterol is less than 130. I will monitor his chronic kidney disease for any deterioration. Continue to refrain from any NSAIDs. Monitor the patient's liver function test.  02/27/17 The patient's blood pressure today is outstanding at 118/70. He denies any chest pain shortness of breath or dyspnea on exertion. He reports a normal urine output. He denies any blood in his urine. He has experienced weight gain which seems to be attributable to the Zyprexa. Recently his psychiatrist switched him to invega, added temazepam, and increased lamictal and stopped zyprexa. The recent medicine changes, the patient's weight is up 23 pounds from approximately 1 year ago Wt Readings from Last 3  Encounters:  08/01/17 198 lb (89.8 kg)  07/24/17 194 lb (88 kg)  05/02/17 198 lb (89.8 kg)  At that time, my plan was: Psychiatrist also recently started clonidine. As a result the patient's blood pressure is very well controlled at 118/70. I would not change his antihypertensive medication any further. I will check a CMP today to monitor his renal function as well as his liver function test given his history of elevated liver function tests in the past. I'll also check a CBC to monitor for anemia. Otherwise continue control his blood pressure, avoid NSAIDs, and try to work on exercise to achieve weight loss  04/03/17 GFR had dropped significantly to 54.  Renal US in 7/17 was unremarkable.  Has been drinking more fluid.  Also taking muscle milk protein supplements 3 times a week and working out more.  At that time, my plan was: Check bmp and ua.  If renal fcn is worsening, will consult neprology about possible biopsy especially if hematuria or proteinuria is present.  DC muscle milk supplements.  05/02/17 At that visit, the patient's urinalysis was significant for +3 hemoglobin however on microscopic exam, there were no red blood cells leading me to be concerned that it was actually myoglobin detected by the urinalysis related to his weightlifting and muscle supplements that he was taking. Fortunately his creatinine was actually improved from 1.7 down to 1.5 to. He is here today to recheck a urinalysis to ensure resolution of the +3 hemoglobin and monitor his renal function.  At that time,  my plan was: He has lost some weight since his last visit however he is doing this by eating a healthy diet with natural proteins and exercising by walking or jogging. He is no lifting weights or taking any protein muscle builder supplements. Hopefully the myoglobinuria will have resolved and his renal function will be improved or at least stable.  08/01/17 Patient is here today to recheck his renal function. He has  stable stage III chronic kidney disease with a serum creatinine that averages between 1.5 and 1.7. His blood pressure today is borderline elevated at 140/92 however he was rushing to get here and was late due to traffic and he believes that this elevated his blood pressure. At his last visit, his blood pressure was better. He denies any oliguria. He denies any weakness or fatigue. He did recently strained his left ankle while walking but this is slowly getting better  Past Medical History:  Diagnosis Date  . CKD (chronic kidney disease), stage I    Past Surgical History:  Procedure Laterality Date  . NASAL SEPTUM SURGERY  1999   Current Outpatient Prescriptions on File Prior to Visit  Medication Sig Dispense Refill  . ALPRAZolam (XANAX) 0.5 MG tablet Take 1 tablet (0.5 mg total) by mouth 3 (three) times daily as needed for anxiety. 30 tablet 0  . amLODipine (NORVASC) 10 MG tablet TAKE 1 TABLET (10 MG TOTAL) BY MOUTH DAILY. 30 tablet 3  . azelastine (ASTELIN) 0.1 % nasal spray PLACE 2 SPRAYS INTO BOTH NOSTRILS 2 (TWO) TIMES DAILY AS DIRECTED 30 mL 4  . cetirizine (ZYRTEC) 10 MG tablet Take 10 mg by mouth daily.    Marland Kitchen EPIPEN 2-PAK 0.3 MG/0.3ML SOAJ injection     . Flaxseed, Linseed, (FLAX SEED OIL PO) Take by mouth.    . fluticasone (FLONASE) 50 MCG/ACT nasal spray PLACE 2 SPRAYS INTO BOTH NOSTRILS DAILY. 48 g 2  . lamoTRIgine (LAMICTAL) 150 MG tablet Take 300 mg by mouth daily.     . Melatonin 5 MG TABS Take by mouth.    . montelukast (SINGULAIR) 10 MG tablet     . Multiple Vitamin (MULTIVITAMIN) tablet Take 1 tablet by mouth daily.    . paliperidone (INVEGA) 6 MG 24 hr tablet Take 6 mg by mouth 2 (two) times daily.      No current facility-administered medications on file prior to visit.    No Known Allergies Social History   Social History  . Marital status: Married    Spouse name: N/A  . Number of children: N/A  . Years of education: N/A   Occupational History  . Not on file.    Social History Main Topics  . Smoking status: Never Smoker  . Smokeless tobacco: Never Used  . Alcohol use No  . Drug use: No  . Sexual activity: Not on file   Other Topics Concern  . Not on file   Social History Narrative  . No narrative on file   No family history on file.   Review of Systems  All other systems reviewed and are negative.      Objective:   Physical Exam  Constitutional: He appears well-developed and well-nourished. No distress.  Neck: Normal range of motion. Neck supple. No JVD present. No thyromegaly present.  Cardiovascular: Normal rate, regular rhythm, normal heart sounds and intact distal pulses.  Exam reveals no gallop and no friction rub.   No murmur heard. Pulmonary/Chest: Effort normal and breath sounds normal. No respiratory  distress. He has no wheezes. He has no rales. He exhibits no tenderness.  Abdominal: Soft. Bowel sounds are normal. He exhibits no distension. There is no tenderness. There is no rebound and no guarding.  Musculoskeletal: He exhibits no edema.  Skin: Skin is warm. He is not diaphoretic.  Vitals reviewed.         Assessment & Plan:  CKD (chronic kidney disease), stage III ([place]) - Plan: BASIC METABOLIC PANEL WITH GFR Repeat BMP. If creatinine is stable, I will check his renal function every 6 months as he is proven stability over the last 6 months. Obviously if worsening, I will consult nephrology. Monitor blood pressure. Goal blood pressures less than 140/90

## 2017-08-02 LAB — BASIC METABOLIC PANEL WITH GFR
BUN / CREAT RATIO: 17 (calc) (ref 6–22)
BUN: 23 mg/dL (ref 7–25)
CO2: 27 mmol/L (ref 20–32)
Calcium: 10.7 mg/dL — ABNORMAL HIGH (ref 8.6–10.3)
Chloride: 100 mmol/L (ref 98–110)
Creat: 1.36 mg/dL — ABNORMAL HIGH (ref 0.60–1.35)
GFR, EST AFRICAN AMERICAN: 71 mL/min/{1.73_m2} (ref >=60)
GFR, EST NON AFRICAN AMERICAN: 62 mL/min/{1.73_m2} (ref >=60)
Glucose, Bld: 108 mg/dL — ABNORMAL HIGH (ref 65–99)
POTASSIUM: 4.3 mmol/L (ref 3.5–5.3)
Sodium: 136 mmol/L (ref 135–146)

## 2017-09-16 ENCOUNTER — Other Ambulatory Visit: Payer: Self-pay | Admitting: Family Medicine

## 2017-11-11 ENCOUNTER — Ambulatory Visit: Payer: BC Managed Care – PPO | Admitting: Family Medicine

## 2017-11-11 ENCOUNTER — Encounter: Payer: Self-pay | Admitting: Family Medicine

## 2017-11-11 VITALS — BP 110/70 | HR 88 | Temp 98.3°F | Resp 12 | Ht 67.0 in | Wt 198.0 lb

## 2017-11-11 DIAGNOSIS — R1013 Epigastric pain: Secondary | ICD-10-CM | POA: Diagnosis not present

## 2017-11-11 LAB — CBC WITH DIFFERENTIAL/PLATELET
BASOS ABS: 32 {cells}/uL (ref 0–200)
BASOS PCT: 0.7 %
EOS PCT: 1.1 %
Eosinophils Absolute: 51 cells/uL (ref 15–500)
HEMATOCRIT: 39.6 % (ref 38.5–50.0)
HEMOGLOBIN: 13.8 g/dL (ref 13.2–17.1)
Lymphs Abs: 1679 cells/uL (ref 850–3900)
MCH: 28.8 pg (ref 27.0–33.0)
MCHC: 34.8 g/dL (ref 32.0–36.0)
MCV: 82.5 fL (ref 80.0–100.0)
MPV: 8.9 fL (ref 7.5–12.5)
Monocytes Relative: 7.7 %
NEUTROS ABS: 2484 {cells}/uL (ref 1500–7800)
Neutrophils Relative %: 54 %
Platelets: 398 10*3/uL (ref 140–400)
RBC: 4.8 10*6/uL (ref 4.20–5.80)
RDW: 12 % (ref 11.0–15.0)
Total Lymphocyte: 36.5 %
WBC mixed population: 354 cells/uL (ref 200–950)
WBC: 4.6 10*3/uL (ref 3.8–10.8)

## 2017-11-11 LAB — COMPLETE METABOLIC PANEL WITH GFR
AG Ratio: 1.7 (calc) (ref 1.0–2.5)
ALT: 27 U/L (ref 9–46)
AST: 20 U/L (ref 10–40)
Albumin: 4.5 g/dL (ref 3.6–5.1)
Alkaline phosphatase (APISO): 107 U/L (ref 40–115)
BUN/Creatinine Ratio: 8 (calc) (ref 6–22)
BUN: 11 mg/dL (ref 7–25)
CALCIUM: 10.4 mg/dL — AB (ref 8.6–10.3)
CO2: 29 mmol/L (ref 20–32)
CREATININE: 1.43 mg/dL — AB (ref 0.60–1.35)
Chloride: 106 mmol/L (ref 98–110)
GFR, EST NON AFRICAN AMERICAN: 57 mL/min/{1.73_m2} — AB (ref >=60)
GFR, Est African American: 67 mL/min/{1.73_m2} (ref >=60)
GLOBULIN: 2.6 g/dL (ref 1.9–3.7)
Glucose, Bld: 103 mg/dL — ABNORMAL HIGH (ref 65–99)
Potassium: 4.5 mmol/L (ref 3.5–5.3)
SODIUM: 141 mmol/L (ref 135–146)
TOTAL PROTEIN: 7.1 g/dL (ref 6.1–8.1)
Total Bilirubin: 0.3 mg/dL (ref 0.2–1.2)

## 2017-11-11 LAB — SEDIMENTATION RATE: Sed Rate: 11 mm/h (ref 0–15)

## 2017-11-11 LAB — LIPASE: Lipase: 16 U/L (ref 7–60)

## 2017-11-11 NOTE — Progress Notes (Signed)
Subjective:    Patient ID: Dustin Solis, male    DOB: 10/04/1971, 47 y.o.   MRN: [id number]  Medication Refill     The patient states that he has had stomach trouble his entire life area however recently over the last 3-4 weeks, it has become more frequent.  He reports almost daily nausea.  He reports occasional vomiting.  He reports occasional crampy abdominal pain.  There are no inciting factors.  There is no particular food that triggers it.  Food does not make it worse.  He has had more reflux recently.  When I asked him to pinpoint where he feels stomach discomfort, he points in a circular pattern over his entire abdomen.  He denies any consistent diarrhea.  He denies any constipation.  He denies any melena or hematochezia.  He denies any fevers.  He denies any weight loss.  Primary concern is almost daily nausea, occasional vomiting, and occasional crampy abdominal pain.  Of note, his psychiatrist recently decreased his dose of invega substantially..  The symptoms began shortly thereafter  Past Medical History:  Diagnosis Date  . CKD (chronic kidney disease), stage I    Past Surgical History:  Procedure Laterality Date  . NASAL SEPTUM SURGERY  1999   Current Outpatient Medications on File Prior to Visit  Medication Sig Dispense Refill  . ALPRAZolam (XANAX) 0.5 MG tablet Take 1 tablet (0.5 mg total) by mouth 3 (three) times daily as needed for anxiety. 30 tablet 0  . amLODipine (NORVASC) 10 MG tablet TAKE 1 TABLET (10 MG TOTAL) BY MOUTH DAILY. 30 tablet 3  . azelastine (ASTELIN) 0.1 % nasal spray PLACE 2 SPRAYS INTO BOTH NOSTRILS 2 (TWO) TIMES DAILY AS DIRECTED 30 mL 4  . cetirizine (ZYRTEC) 10 MG tablet Take 10 mg by mouth daily.    Marland Kitchen EPIPEN 2-PAK 0.3 MG/0.3ML SOAJ injection     . Flaxseed, Linseed, (FLAX SEED OIL PO) Take by mouth.    . fluticasone (FLONASE) 50 MCG/ACT nasal spray PLACE 2 SPRAYS INTO BOTH NOSTRILS DAILY. 48 g 2  . lamoTRIgine (LAMICTAL) 150 MG tablet Take 300  mg by mouth daily.     . montelukast (SINGULAIR) 10 MG tablet     . Multiple Vitamin (MULTIVITAMIN) tablet Take 1 tablet by mouth daily.    . paliperidone (INVEGA) 3 MG 24 hr tablet      No current facility-administered medications on file prior to visit.    No Known Allergies Social History   Socioeconomic History  . Marital status: Married    Spouse name: Not on file  . Number of children: Not on file  . Years of education: Not on file  . Highest education level: Not on file  Social Needs  . Financial resource strain: Not on file  . Food insecurity - worry: Not on file  . Food insecurity - inability: Not on file  . Transportation needs - medical: Not on file  . Transportation needs - non-medical: Not on file  Occupational History  . Not on file  Tobacco Use  . Smoking status: Never Smoker  . Smokeless tobacco: Never Used  Substance and Sexual Activity  . Alcohol use: No  . Drug use: No  . Sexual activity: Not on file  Other Topics Concern  . Not on file  Social History Narrative  . Not on file   No family history on file.   Review of Systems  All other systems reviewed and are negative.  Objective:   Physical Exam  Constitutional: He appears well-developed and well-nourished. No distress.  Neck: Normal range of motion. Neck supple. No JVD present. No thyromegaly present.  Cardiovascular: Normal rate, regular rhythm, normal heart sounds and intact distal pulses. Exam reveals no gallop and no friction rub.  No murmur heard. Pulmonary/Chest: Effort normal and breath sounds normal. No respiratory distress. He has no wheezes. He has no rales. He exhibits no tenderness.  Abdominal: Soft. Bowel sounds are normal. He exhibits no distension. There is no tenderness. There is no rebound and no guarding.  Musculoskeletal: He exhibits no edema.  Skin: Skin is warm. He is not diaphoretic.  Vitals reviewed.         Assessment & Plan:  Dyspepsia - Plan: CBC with  Differential/Platelet, COMPLETE METABOLIC PANEL WITH GFR, Lipase, Sedimentation rate, H. pylori breath test I will obtain baseline lab work including a CMP to monitor kidney and liver function test along with bilirubin, lipase check his pancreas, a sed rate to look for evidence of inflammatory bowel disease, and an H. pylori test.  If lab work is normal, my leading suspicion is irritable bowel secondary to anxiety and I would recommend Phenergan and Bentyl as needed.  Obviously if lab work is abnormal, we will pursue the lab abnormality.  If symptoms persist, I would also consult GI for possible endoscopy.  Also in the differential diagnosis is GERD.  Consider a PPI if symptoms persist.

## 2017-11-12 LAB — H. PYLORI BREATH TEST: H. pylori Breath Test: NOT DETECTED

## 2017-11-19 ENCOUNTER — Encounter: Payer: Self-pay | Admitting: Family Medicine

## 2017-11-19 ENCOUNTER — Other Ambulatory Visit: Payer: Self-pay | Admitting: Family Medicine

## 2017-11-19 MED ORDER — DICYCLOMINE HCL 20 MG PO TABS: 20.0000 mg | ORAL_TABLET | Freq: Four times a day (QID) | ORAL | 1 refills | Status: DC

## 2017-11-19 MED ORDER — PROMETHAZINE HCL 12.5 MG PO TABS: 12.5000 mg | ORAL_TABLET | Freq: Four times a day (QID) | ORAL | 0 refills | Status: DC | PRN

## 2017-11-20 ENCOUNTER — Other Ambulatory Visit: Payer: BC Managed Care – PPO

## 2017-11-21 LAB — PTH, INTACT AND CALCIUM
CALCIUM: 10.2 mg/dL (ref 8.6–10.3)
PTH: 40 pg/mL (ref 14–64)

## 2017-11-24 ENCOUNTER — Encounter: Payer: Self-pay | Admitting: Family Medicine

## 2017-11-26 ENCOUNTER — Other Ambulatory Visit: Payer: Self-pay | Admitting: Family Medicine

## 2017-12-08 ENCOUNTER — Telehealth: Payer: Self-pay | Admitting: Family Medicine

## 2017-12-08 ENCOUNTER — Other Ambulatory Visit: Payer: Self-pay | Admitting: Family Medicine

## 2017-12-08 MED ORDER — CYCLOBENZAPRINE HCL 10 MG PO TABS: 10.0000 mg | ORAL_TABLET | Freq: Three times a day (TID) | ORAL | 0 refills | Status: DC | PRN

## 2017-12-08 NOTE — Telephone Encounter (Signed)
[facility] to [facility] at [employer] [facility].

## 2017-12-08 NOTE — Telephone Encounter (Signed)
Med needed to go to [facility] - med cxd at [employer] and resent to [place]. Pt's wife aware and states to cancel apt for in the am. Apt cxd.

## 2017-12-08 NOTE — Telephone Encounter (Signed)
There is no message in this note

## 2017-12-08 NOTE — Telephone Encounter (Signed)
Pt's wife called [facility] stating that pt has pulled another muscle in his back and would like to know if we could call in some of the Flexeril that Dr. Buelah Manis called in for him last time this happened. (2016) ???

## 2017-12-09 ENCOUNTER — Ambulatory Visit: Payer: BC Managed Care – PPO | Admitting: Family Medicine

## 2018-01-18 ENCOUNTER — Other Ambulatory Visit: Payer: Self-pay | Admitting: Family Medicine

## 2018-03-13 ENCOUNTER — Ambulatory Visit: Payer: BC Managed Care – PPO | Admitting: Family Medicine

## 2018-03-16 ENCOUNTER — Encounter: Payer: Self-pay | Admitting: Family Medicine

## 2018-03-17 ENCOUNTER — Ambulatory Visit: Payer: BC Managed Care – PPO | Admitting: Family Medicine

## 2018-03-17 VITALS — BP 130/84 | HR 87 | Temp 98.3°F | Ht 67.0 in | Wt 195.0 lb

## 2018-03-17 DIAGNOSIS — N183 Chronic kidney disease, stage 3 unspecified: Secondary | ICD-10-CM

## 2018-03-17 DIAGNOSIS — J302 Other seasonal allergic rhinitis: Secondary | ICD-10-CM

## 2018-03-17 MED ORDER — PREDNISONE 20 MG PO TABS: ORAL_TABLET | ORAL | 0 refills | Status: DC

## 2018-03-17 NOTE — Progress Notes (Signed)
Subjective:    Patient ID: Dustin Solis, male    DOB: Feb 04, 1971, 47 y.o.   MRN: [id number]  HPI Patient presents today complaining of head congestion and chest congestion.  He has severe seasonal allergies and is currently on Zyrtec, Singulair, Astelin, and Flonase.  2 weeks ago, the patient admits that he had been noncompliant with his Flonase and Astelin.  He developed rhinorrhea, sneezing, head congestion, postnasal drip, constantly clearing his throat, congestion in his throat and his airways, and a nonproductive cough.  He has since resumed all of his allergy medication however the symptoms have not gone away.  He denies any fevers or chills.  He denies any pleurisy.  He denies any wheezing.  He denies any purulent sputum.  He denies any shortness of breath. Past Medical History:  Diagnosis Date  . CKD (chronic kidney disease), stage I    Past Surgical History:  Procedure Laterality Date  . NASAL SEPTUM SURGERY  1999   Current Outpatient Medications on File Prior to Visit  Medication Sig Dispense Refill  . ALPRAZolam (XANAX) 0.5 MG tablet Take 1 tablet (0.5 mg total) by mouth 3 (three) times daily as needed for anxiety. 30 tablet 0  . amLODipine (NORVASC) 10 MG tablet TAKE 1 TABLET (10 MG TOTAL) BY MOUTH DAILY. 30 tablet 3  . azelastine (ASTELIN) 0.1 % nasal spray PLACE 2 SPRAYS INTO BOTH NOSTRILS 2 (TWO) TIMES DAILY AS DIRECTED 30 mL 4  . cetirizine (ZYRTEC) 10 MG tablet Take 10 mg by mouth daily.    . cyclobenzaprine (FLEXERIL) 10 MG tablet Take 1 tablet (10 mg total) by mouth 3 (three) times daily as needed for muscle spasms. 30 tablet 0  . dicyclomine (BENTYL) 20 MG tablet Take 1 tablet (20 mg total) by mouth every 6 (six) hours. Prn abdominal cramping 60 tablet 1  . EPIPEN 2-PAK 0.3 MG/0.3ML SOAJ injection     . fluticasone (FLONASE) 50 MCG/ACT nasal spray PLACE 2 SPRAYS INTO BOTH NOSTRILS DAILY. 48 g 2  . lamoTRIgine (LAMICTAL) 150 MG tablet Take 300 mg by mouth daily.       . montelukast (SINGULAIR) 10 MG tablet     . Multiple Vitamin (MULTIVITAMIN) tablet Take 1 tablet by mouth daily.    . paliperidone (INVEGA) 3 MG 24 hr tablet     . promethazine (PHENERGAN) 12.5 MG tablet Take 1 tablet (12.5 mg total) by mouth every 6 (six) hours as needed for nausea or vomiting. 30 tablet 0   No current facility-administered medications on file prior to visit.    No Known Allergies Social History   Socioeconomic History  . Marital status: Married    Spouse name: Not on file  . Number of children: Not on file  . Years of education: Not on file  . Highest education level: Not on file  Occupational History  . Not on file  Social Needs  . Financial resource strain: Not on file  . Food insecurity:    Worry: Not on file    Inability: Not on file  . Transportation needs:    Medical: Not on file    Non-medical: Not on file  Tobacco Use  . Smoking status: Never Smoker  . Smokeless tobacco: Never Used  Substance and Sexual Activity  . Alcohol use: No  . Drug use: No  . Sexual activity: Not on file  Lifestyle  . Physical activity:    Days per week: Not on file    Minutes  per session: Not on file  . Stress: Not on file  Relationships  . Social connections:    Talks on phone: Not on file    Gets together: Not on file    Attends religious service: Not on file    Active member of club or organization: Not on file    Attends meetings of clubs or organizations: Not on file    Relationship status: Not on file  . Intimate partner violence:    Fear of current or ex partner: Not on file    Emotionally abused: Not on file    Physically abused: Not on file    Forced sexual activity: Not on file  Other Topics Concern  . Not on file  Social History Narrative  . Not on file      Review of Systems  All other systems reviewed and are negative.      Objective:   Physical Exam  Constitutional: He appears well-developed and well-nourished. No distress.  HENT:   Right Ear: External ear normal.  Left Ear: External ear normal.  Nose: Mucosal edema and rhinorrhea present. Right sinus exhibits no maxillary sinus tenderness and no frontal sinus tenderness. Left sinus exhibits no maxillary sinus tenderness and no frontal sinus tenderness.  Mouth/Throat: Oropharynx is clear and moist. No oropharyngeal exudate.  Eyes: Conjunctivae are normal. Right eye exhibits no discharge. Left eye exhibits no discharge.  Neck: Neck supple.  Cardiovascular: Normal rate, regular rhythm and normal heart sounds.  Pulmonary/Chest: Effort normal and breath sounds normal. No stridor. No respiratory distress. He has no wheezes.  Lymphadenopathy:    He has no cervical adenopathy.  Skin: He is not diaphoretic.  Vitals reviewed.         Assessment & Plan:  CKD (chronic kidney disease) stage 3, GFR 30-59 ml/min (HCC) - Plan: BASIC METABOLIC PANEL WITH GFR  Seasonal allergies  Recommended treating seasonal allergies temporarily with prednisone taper pack.  Continue Zyrtec, Flonase, Astelin, and Singulair.  If symptoms do not improve, consider possible chronic sinusitis is also a potential cause for his symptoms.  However his exam today shows no evidence of sinus tenderness to palpation.  He denies any facial pain or fevers.

## 2018-03-18 LAB — BASIC METABOLIC PANEL WITH GFR
BUN/Creatinine Ratio: 9 (calc) (ref 6–22)
BUN: 14 mg/dL (ref 7–25)
CO2: 26 mmol/L (ref 20–32)
Calcium: 11 mg/dL — ABNORMAL HIGH (ref 8.6–10.3)
Chloride: 101 mmol/L (ref 98–110)
Creat: 1.5 mg/dL — ABNORMAL HIGH (ref 0.60–1.35)
GFR, EST NON AFRICAN AMERICAN: 54 mL/min/{1.73_m2} — AB (ref >=60)
GFR, Est African American: 63 mL/min/{1.73_m2} (ref >=60)
GLUCOSE: 118 mg/dL — AB (ref 65–99)
POTASSIUM: 4.6 mmol/L (ref 3.5–5.3)
Sodium: 136 mmol/L (ref 135–146)

## 2018-03-27 ENCOUNTER — Other Ambulatory Visit: Payer: Self-pay | Admitting: Family Medicine

## 2018-04-02 ENCOUNTER — Other Ambulatory Visit: Payer: BC Managed Care – PPO

## 2018-04-03 LAB — ANGIOTENSIN CONVERTING ENZYME: Angiotensin-Converting Enzyme: 30 U/L (ref 9–67)

## 2018-04-06 LAB — PTH, INTACT AND CALCIUM
Calcium: 10.3 mg/dL (ref 8.6–10.3)
PTH: 29 pg/mL (ref 14–64)

## 2018-04-06 LAB — PTH-RELATED PEPTIDE: PTH-RELATED PROTEIN (PTH-RP): 12 pg/mL — AB (ref 14–27)

## 2018-04-16 ENCOUNTER — Other Ambulatory Visit: Payer: Self-pay | Admitting: Family Medicine

## 2018-06-09 ENCOUNTER — Other Ambulatory Visit: Payer: Self-pay | Admitting: Family Medicine

## 2018-08-04 ENCOUNTER — Other Ambulatory Visit: Payer: Self-pay | Admitting: Family Medicine

## 2018-08-04 ENCOUNTER — Encounter: Payer: Self-pay | Admitting: Family Medicine

## 2018-08-04 ENCOUNTER — Ambulatory Visit: Payer: BC Managed Care – PPO | Admitting: Family Medicine

## 2018-08-04 ENCOUNTER — Other Ambulatory Visit: Payer: Self-pay

## 2018-08-04 ENCOUNTER — Ambulatory Visit
Admission: RE | Admit: 2018-08-04 | Discharge: 2018-08-04 | Disposition: A | Discharge status: Home / Self Care | Payer: BC Managed Care – PPO | Source: Ambulatory Visit | Attending: Family Medicine | Admitting: Family Medicine

## 2018-08-04 VITALS — BP 130/76 | HR 74 | Temp 98.4°F | Resp 16 | Ht 67.0 in | Wt 201.0 lb

## 2018-08-04 DIAGNOSIS — R1013 Epigastric pain: Secondary | ICD-10-CM

## 2018-08-04 DIAGNOSIS — R42 Dizziness and giddiness: Secondary | ICD-10-CM | POA: Diagnosis not present

## 2018-08-04 DIAGNOSIS — R110 Nausea: Secondary | ICD-10-CM

## 2018-08-04 DIAGNOSIS — R197 Diarrhea, unspecified: Secondary | ICD-10-CM | POA: Diagnosis not present

## 2018-08-04 DIAGNOSIS — R140 Abdominal distension (gaseous): Secondary | ICD-10-CM | POA: Diagnosis not present

## 2018-08-04 LAB — URINALYSIS, ROUTINE W REFLEX MICROSCOPIC
BACTERIA UA: NONE SEEN /HPF
Bilirubin Urine: NEGATIVE
Glucose, UA: NEGATIVE
Ketones, ur: NEGATIVE
Leukocytes, UA: NEGATIVE
Nitrite: NEGATIVE
PH: 6 (ref 5.0–8.0)
Protein, ur: NEGATIVE
SPECIFIC GRAVITY, URINE: 1.015 (ref 1.001–1.03)
SQUAMOUS EPITHELIAL / LPF: NONE SEEN /HPF (ref <=5)
WBC, UA: NONE SEEN /HPF (ref 0–5)

## 2018-08-04 LAB — MICROSCOPIC MESSAGE

## 2018-08-04 MED ORDER — PROMETHAZINE HCL 12.5 MG RE SUPP: 12.5000 mg | Freq: Four times a day (QID) | RECTAL | 0 refills | Status: DC | PRN

## 2018-08-04 MED ORDER — OMEPRAZOLE 20 MG PO CPDR: 20.0000 mg | DELAYED_RELEASE_CAPSULE | Freq: Every day | ORAL | 3 refills | Status: DC

## 2018-08-04 MED ORDER — PROMETHAZINE HCL 12.5 MG PO TABS: 12.5000 mg | ORAL_TABLET | Freq: Four times a day (QID) | ORAL | 0 refills | Status: DC | PRN

## 2018-08-04 NOTE — Patient Instructions (Signed)
Go get abdominal Xray done at [facility]  Your symptoms sound most like a virus or some gastritis or peptic ulcer disease.  Plan to treat with antiemetics/anti-nauseas, screen with xray, and start a trial of an acid blocking medicine.  Also labs to screen for any elevation with liver, pancreas enzymes, or signs of infection.

## 2018-08-04 NOTE — Progress Notes (Signed)
Patient ID: Dustin Solis, male    DOB: 08-09-1971, 47 y.o.   MRN: [id number]  PCP: Susy Frizzle, MD  Chief Complaint  Patient presents with  . GI Issues    x1 week- constant nausea, vomiting x1, reflux    Subjective:   Dustin Solis is a 47 y.o. male, presents to clinic with CC of nausea x 1.5 weeks and one episode vomiting one week ago 07/25/18 had exposure to [facility], it makes his allergies upset, gives him ear sx and vertigo and that makes him vomit, he has had the vertigo and allergies all his life however his nausea has persisted since this one episode of vomiting which is not normal for him.  He also has had associated "upset stomach", bloating, "rumbing."  Asked if he has abdominal pain he denies this but does point to his epigastric area and general upper abdominal area stating that this is the location of his discomfort and rumbling.  Since one episode of vomiting associated with the air conditioning he has had no other vomiting episodes.  Yesterday he had one normal formed stool followed by one episode of diarrhea, scribed as brown and loose.  Bowel movement yet today.  He denies any hematochezia.  Last week he thought his stool looked dark one day but he denies any tarry appearance, denies any Imodium use.  Denies any fever, chills, sweats, weakness, near-syncope.  He denies any sick contacts  He usually takes Dramamine for vertigo episodes, for nausea he takes Phenergan oral or sometimes uses suppositories and he is out of the Phenergan prescriptions.    Vertigo and ears have been going on "all his life" last seen ENT in April, sees once a year.  He has had no change to the frequency or onset of vertigo.  Has not tried any other medicines except for Dramamine.  He denies any headaches, visual disturbances, numbness, focal weakness, slurred speech or drooping face.   Patient Active Problem List   Diagnosis Date Noted  . Sinusitis, chronic 01/03/2016  . Chronic allergic  rhinitis 01/03/2016  . CKD (chronic kidney disease), stage I   . Cervical strain, acute 02/22/2015     Prior to Admission medications   Medication Sig Start Date End Date Taking? Authorizing Provider  amLODipine (NORVASC) 10 MG tablet TAKE 1 TABLET (10 MG TOTAL) BY MOUTH DAILY. 06/09/18  Yes [place], Modena Nunnery, MD  azelastine (ASTELIN) 0.1 % nasal spray PLACE 2 SPRAYS INTO BOTH NOSTRILS 2 (TWO) TIMES DAILY AS DIRECTED 12/20/16  Yes Susy Frizzle, MD  cetirizine (ZYRTEC) 10 MG tablet Take 10 mg by mouth daily.   Yes [provider]  dicyclomine (BENTYL) 20 MG tablet Take 1 tablet (20 mg total) by mouth every 6 (six) hours. Prn abdominal cramping 11/19/17  Yes Susy Frizzle, MD  EPIPEN 2-PAK 0.3 MG/0.3ML SOAJ injection  01/22/16  Yes [provider]  fluticasone (FLONASE) 50 MCG/ACT nasal spray PLACE 2 SPRAYS INTO BOTH NOSTRILS DAILY. 11/26/17  Yes Susy Frizzle, MD  lamoTRIgine (LAMICTAL) 150 MG tablet Take 300 mg by mouth daily.  02/23/17  Yes [provider]  montelukast (SINGULAIR) 10 MG tablet  01/22/16  Yes [provider]  Multiple Vitamin (MULTIVITAMIN) tablet Take 1 tablet by mouth daily.   Yes [provider]  paliperidone (INVEGA) 3 MG 24 hr tablet  10/30/17  Yes [provider]  predniSONE (DELTASONE) 20 MG tablet 3 tabs poqday 1-2, 2 tabs poqday 3-4,  1 tab poqday 5-6 03/17/18  Yes [place], Cammie Mcgee, MD  promethazine (PHENERGAN) 12.5 MG tablet Take 1 tablet (12.5 mg total) by mouth every 6 (six) hours as needed for nausea or vomiting. 11/19/17  Yes Susy Frizzle, MD     No Known Allergies   History reviewed. No pertinent family history.   Social History   Socioeconomic History  . Marital status: Married    Spouse name: Not on file  . Number of children: Not on file  . Years of education: Not on file  . Highest education level: Not on file  Occupational History  . Not on file  Social Needs  . Financial resource  strain: Not on file  . Food insecurity:    Worry: Not on file    Inability: Not on file  . Transportation needs:    Medical: Not on file    Non-medical: Not on file  Tobacco Use  . Smoking status: Never Smoker  . Smokeless tobacco: Never Used  Substance and Sexual Activity  . Alcohol use: No  . Drug use: No  . Sexual activity: Not on file  Lifestyle  . Physical activity:    Days per week: Not on file    Minutes per session: Not on file  . Stress: Not on file  Relationships  . Social connections:    Talks on phone: Not on file    Gets together: Not on file    Attends religious service: Not on file    Active member of club or organization: Not on file    Attends meetings of clubs or organizations: Not on file    Relationship status: Not on file  . Intimate partner violence:    Fear of current or ex partner: Not on file    Emotionally abused: Not on file    Physically abused: Not on file    Forced sexual activity: Not on file  Other Topics Concern  . Not on file  Social History Narrative  . Not on file     Review of Systems  Constitutional: Negative.   HENT: Negative.   Eyes: Negative.   Respiratory: Negative.   Cardiovascular: Negative.   Gastrointestinal: Negative.   Endocrine: Negative.   Genitourinary: Negative.   Musculoskeletal: Negative.   Skin: Negative.   Allergic/Immunologic: Negative.   Neurological: Negative.   Hematological: Negative.   Psychiatric/Behavioral: Negative.   All other systems reviewed and are negative.      Objective:    Vitals:   08/04/18 1424  BP: 130/76  Pulse: 74  Resp: 16  Temp: 98.4 F (36.9 C)  TempSrc: Oral  SpO2: 97%  Weight: 201 lb (91.2 kg)  Height: 5\' 7"  (1.702 m)      Physical Exam  Constitutional: He is oriented to person, place, and time. He appears well-developed and well-nourished.  Non-toxic appearance. He does not appear ill. No distress.  HENT:  Head: Normocephalic and atraumatic.  Right Ear:  Tympanic membrane, external ear and ear canal normal.  Left Ear: Tympanic membrane, external ear and ear canal normal.  Nose: No mucosal edema or rhinorrhea. Right sinus exhibits no maxillary sinus tenderness and no frontal sinus tenderness. Left sinus exhibits no maxillary sinus tenderness and no frontal sinus tenderness.  Mouth/Throat: Uvula is midline and oropharynx is clear and moist. No trismus in the jaw. No uvula swelling. No oropharyngeal exudate, posterior oropharyngeal edema or posterior oropharyngeal erythema.  Large b/l turbinates injected/irritated, eythematous TM's b/l normal  Eyes:  Pupils are equal, round, and reactive to light. Conjunctivae, EOM and lids are normal.  Neck: Trachea normal, normal range of motion and phonation normal. Neck supple. No tracheal deviation present.  Cardiovascular: Regular rhythm, normal heart sounds and normal pulses. Exam reveals no gallop and no friction rub.  No murmur heard. Pulses:      Radial pulses are 2+ on the right side, and 2+ on the left side.       Posterior tibial pulses are 2+ on the right side, and 2+ on the left side.  Pulmonary/Chest: Effort normal and breath sounds normal. He has no wheezes. He has no rhonchi. He has no rales.  Abdominal: Soft. Normal appearance. He exhibits no distension and no mass. There is no tenderness. There is no rebound and no guarding.  Tympanic abd, bloated, not rigid Hypoactive bowel sounds  Musculoskeletal: Normal range of motion. He exhibits no edema.  Lymphadenopathy:    He has no cervical adenopathy.  Neurological: He is alert and oriented to person, place, and time. No cranial nerve deficit or sensory deficit. He exhibits normal muscle tone. Coordination and gait normal.  Skin: Skin is warm, dry and intact. Capillary refill takes less than 2 seconds. No rash noted. He is not diaphoretic.  Psychiatric: He has a normal mood and affect. His speech is normal and behavior is normal.  Nursing note and  vitals reviewed.         Assessment & Plan:      ICD-10-CM   1. Nausea R11.0 promethazine (PHENERGAN) 12.5 MG tablet    promethazine (PHENERGAN) 12.5 MG suppository    DG Abd 1 View    Urinalysis, Routine w reflex microscopic    Lipase    COMPLETE METABOLIC PANEL WITH GFR  2. Vertigo R42 promethazine (PHENERGAN) 12.5 MG tablet    promethazine (PHENERGAN) 12.5 MG suppository    Urinalysis, Routine w reflex microscopic  3. Abdominal bloating R14.0 DG Abd 1 View    Lipase    COMPLETE METABOLIC PANEL WITH GFR  4. Diarrhea, unspecified type R19.7 DG Abd 1 View    Lipase    COMPLETE METABOLIC PANEL WITH GFR    CBC with Differential  5. 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  Had is normal symptoms occur about a week and half ago but since that time but he also has this persistent nausea, worse and more constant than his normal.  He has had some abdominal distention, dyspepsia, and one episode of loose stools that occurred yesterday, no bowel movement today.  He notes dark stools about a week ago but none since, denies any blood in his stool, denies abdominal pain, dysuria, hematuria, urinary frequency.  His bloating and nausea are not related to eating, described as fairly constant.  Will start PPI to cover for possible gastritis or peptic ulcer disease causing symptoms.  Will refill his Phenergan medicines to help with nausea.  Obtain KUB to evaluate his abdominal bloating, currently not concern for any kind of obstruction but he may have some bloating secondary to a GI virus and will use KUB to evaluate constipation although he denies this. Last lab work was in May of this year, will screen with basic lab work to evaluate any abnormalities with liver or pancreas.   Delsa Grana,  PA-C 08/04/18 2:53 PM

## 2018-08-05 LAB — COMPLETE METABOLIC PANEL WITH GFR
AG Ratio: 1.8 (calc) (ref 1.0–2.5)
ALBUMIN MSPROF: 4.8 g/dL (ref 3.6–5.1)
ALT: 32 U/L (ref 9–46)
AST: 24 U/L (ref 10–40)
Alkaline phosphatase (APISO): 157 U/L — ABNORMAL HIGH (ref 40–115)
BILIRUBIN TOTAL: 0.4 mg/dL (ref 0.2–1.2)
BUN / CREAT RATIO: 9 (calc) (ref 6–22)
BUN: 14 mg/dL (ref 7–25)
CO2: 27 mmol/L (ref 20–32)
CREATININE: 1.53 mg/dL — AB (ref 0.60–1.35)
Calcium: 10.5 mg/dL — ABNORMAL HIGH (ref 8.6–10.3)
Chloride: 100 mmol/L (ref 98–110)
GFR, EST AFRICAN AMERICAN: 61 mL/min/{1.73_m2} (ref >=60)
GFR, Est Non African American: 53 mL/min/{1.73_m2} — ABNORMAL LOW (ref >=60)
GLUCOSE: 93 mg/dL (ref 65–99)
Globulin: 2.6 g/dL (calc) (ref 1.9–3.7)
Potassium: 4.6 mmol/L (ref 3.5–5.3)
Sodium: 136 mmol/L (ref 135–146)
TOTAL PROTEIN: 7.4 g/dL (ref 6.1–8.1)

## 2018-08-05 LAB — CBC WITH DIFFERENTIAL/PLATELET
Basophils Absolute: 39 cells/uL (ref 0–200)
Basophils Relative: 0.8 %
EOS ABS: 69 {cells}/uL (ref 15–500)
EOS PCT: 1.4 %
HEMATOCRIT: 45.7 % (ref 38.5–50.0)
HEMOGLOBIN: 15.7 g/dL (ref 13.2–17.1)
Lymphs Abs: 1421 cells/uL (ref 850–3900)
MCH: 28.4 pg (ref 27.0–33.0)
MCHC: 34.4 g/dL (ref 32.0–36.0)
MCV: 82.6 fL (ref 80.0–100.0)
MPV: 9.5 fL (ref 7.5–12.5)
Monocytes Relative: 9.8 %
NEUTROS ABS: 2891 {cells}/uL (ref 1500–7800)
Neutrophils Relative %: 59 %
Platelets: 421 10*3/uL — ABNORMAL HIGH (ref 140–400)
RBC: 5.53 10*6/uL (ref 4.20–5.80)
RDW: 12.2 % (ref 11.0–15.0)
Total Lymphocyte: 29 %
WBC: 4.9 10*3/uL (ref 3.8–10.8)
WBCMIX: 480 {cells}/uL (ref 200–950)

## 2018-08-05 LAB — LIPASE: Lipase: 17 U/L (ref 7–60)

## 2018-08-10 ENCOUNTER — Other Ambulatory Visit: Payer: Self-pay | Admitting: *Deleted

## 2018-08-10 DIAGNOSIS — N183 Chronic kidney disease, stage 3 unspecified: Secondary | ICD-10-CM

## 2018-08-24 ENCOUNTER — Telehealth: Payer: Self-pay

## 2018-08-24 ENCOUNTER — Emergency Department (HOSPITAL_COMMUNITY)
Admission: EM | Admit: 2018-08-24 | Discharge: 2018-08-24 | Disposition: A | Discharge status: Home / Self Care | Payer: BC Managed Care – PPO | Attending: Emergency Medicine | Admitting: Emergency Medicine

## 2018-08-24 ENCOUNTER — Encounter (HOSPITAL_COMMUNITY): Payer: Self-pay | Admitting: Emergency Medicine

## 2018-08-24 ENCOUNTER — Emergency Department (HOSPITAL_COMMUNITY): Payer: BC Managed Care – PPO

## 2018-08-24 ENCOUNTER — Other Ambulatory Visit: Payer: Self-pay

## 2018-08-24 DIAGNOSIS — R609 Edema, unspecified: Secondary | ICD-10-CM

## 2018-08-24 DIAGNOSIS — R220 Localized swelling, mass and lump, head: Secondary | ICD-10-CM

## 2018-08-24 DIAGNOSIS — M542 Cervicalgia: Secondary | ICD-10-CM | POA: Diagnosis not present

## 2018-08-24 DIAGNOSIS — N181 Chronic kidney disease, stage 1: Secondary | ICD-10-CM | POA: Insufficient documentation

## 2018-08-24 DIAGNOSIS — R0602 Shortness of breath: Secondary | ICD-10-CM | POA: Diagnosis not present

## 2018-08-24 DIAGNOSIS — R600 Localized edema: Secondary | ICD-10-CM

## 2018-08-24 LAB — COMPREHENSIVE METABOLIC PANEL
ALBUMIN: 4.5 g/dL (ref 3.5–5.0)
ALT: 49 U/L — AB (ref 0–44)
AST: 40 U/L (ref 15–41)
Alkaline Phosphatase: 126 U/L (ref 38–126)
Anion gap: 9 (ref 5–15)
BUN: 18 mg/dL (ref 6–20)
CO2: 26 mmol/L (ref 22–32)
CREATININE: 1.42 mg/dL — AB (ref 0.61–1.24)
Calcium: 10 mg/dL (ref 8.9–10.3)
Chloride: 104 mmol/L (ref 98–111)
GFR calc Af Amer: >60 mL/min (ref >60)
GFR calc non Af Amer: 57 mL/min — ABNORMAL LOW (ref >60)
GLUCOSE: 100 mg/dL — AB (ref 70–99)
Potassium: 4.2 mmol/L (ref 3.5–5.1)
Sodium: 139 mmol/L (ref 135–145)
Total Bilirubin: 0.6 mg/dL (ref 0.3–1.2)
Total Protein: 7.7 g/dL (ref 6.5–8.1)

## 2018-08-24 LAB — CBC
HEMATOCRIT: 44.9 % (ref 39.0–52.0)
Hemoglobin: 14.7 g/dL (ref 13.0–17.0)
MCH: 28.4 pg (ref 26.0–34.0)
MCHC: 32.7 g/dL (ref 30.0–36.0)
MCV: 86.8 fL (ref 80.0–100.0)
Platelets: 384 10*3/uL (ref 150–400)
RBC: 5.17 MIL/uL (ref 4.22–5.81)
RDW: 12.1 % (ref 11.5–15.5)
WBC: 5 10*3/uL (ref 4.0–10.5)
nRBC: 0 % (ref 0.0–0.2)

## 2018-08-24 LAB — LIPASE, BLOOD: LIPASE: 25 U/L (ref 11–51)

## 2018-08-24 MED ORDER — IOPAMIDOL (ISOVUE-300) INJECTION 61%
INTRAVENOUS | Status: AC
  Filled 2018-08-24: qty 100

## 2018-08-24 MED ORDER — ACETAMINOPHEN 500 MG PO TABS: 1000.0000 mg | ORAL_TABLET | Freq: Four times a day (QID) | ORAL | 0 refills | Status: AC | PRN

## 2018-08-24 MED ORDER — IOHEXOL 300 MG/ML  SOLN
75.0000 mL | Freq: Once | INTRAMUSCULAR | Status: AC | PRN
  Administered 2018-08-24: 75 mL via INTRAVENOUS

## 2018-08-24 NOTE — Discharge Instructions (Signed)
1.  Your CT scan showed some enlargement of the salivary glands but not actual inflammation that would suggest infection.  You have not exhibited signs of infection.  There have however been cases of mumps more recently.  Avoid contact with others and follow-up with your doctor.

## 2018-08-24 NOTE — ED Provider Notes (Signed)
[facility] DEPT Provider Note   CSN: [id number] Arrival date & time: 08/24/18  1609     History   Chief Complaint Chief Complaint  Patient presents with  . Abnormal Lab  . Neck Pain    HPI WILLARD FARQUHARSON is a 47 y.o. male.  HPI Patient reports the beginning of the month he was having some problems with vomiting and  general illness.  He had a number of days where he was throwing up 3-4 times a day.  Reports that has resolved.  He reports he has continued to have some diarrhea over the course of the month.  Denies he has any abdominal pain.  He denies he had fever.  He is continued to have occasional loose stool.  He reports however he was supposed to get some follow-up lab work for previously abnormal labs.  He reports that now over the past day or so he has noted some discomfort along the sides of his face and when he woke up this morning he looked swollen.  He denies he has a sore throat any difficulty swallowing or breathing.  He has not had a fever.  No cough no chest pain. Past Medical History:  Diagnosis Date  . CKD (chronic kidney disease), stage I     Patient Active Problem List   Diagnosis Date Noted  . Sinusitis, chronic 01/03/2016  . Chronic allergic rhinitis 01/03/2016  . CKD (chronic kidney disease), stage I   . Cervical strain, acute 02/22/2015    Past Surgical History:  Procedure Laterality Date  . NASAL SEPTUM SURGERY  1999        Home Medications    Prior to Admission medications   Medication Sig Start Date End Date Taking? Authorizing Provider  amLODipine (NORVASC) 10 MG tablet TAKE 1 TABLET (10 MG TOTAL) BY MOUTH DAILY. 06/09/18  Yes Burton, Modena Nunnery, MD  azelastine (ASTELIN) 0.1 % nasal spray PLACE 2 SPRAYS INTO BOTH NOSTRILS 2 (TWO) TIMES DAILY AS DIRECTED Patient taking differently: Place 1 spray into both nostrils 2 (two) times daily.  12/20/16  Yes Susy Frizzle, MD  cetirizine (ZYRTEC) 10 MG tablet Take 10  mg by mouth daily.   Yes [provider]  fluticasone (FLONASE) 50 MCG/ACT nasal spray PLACE 2 SPRAYS INTO BOTH NOSTRILS DAILY. Patient taking differently: Place 2 sprays into both nostrils daily. PLACE 2 SPRAYS INTO BOTH NOSTRILS DAILY. 11/26/17  Yes Susy Frizzle, MD  lamoTRIgine (LAMICTAL) 150 MG tablet Take 300 mg by mouth daily.  02/23/17  Yes [provider]  montelukast (SINGULAIR) 10 MG tablet Take 10 mg by mouth at bedtime.  01/22/16  Yes [provider]  Multiple Vitamin (MULTIVITAMIN) tablet Take 1 tablet by mouth daily.   Yes [provider]  Multiple Vitamins-Minerals (MULTIVITAMIN ADULT) TABS Take 1 tablet by mouth daily.   Yes [provider]  paliperidone (INVEGA) 3 MG 24 hr tablet Take 3 mg by mouth every evening.  10/30/17  Yes [provider]  promethazine (PHENERGAN) 12.5 MG suppository Place 1 suppository (12.5 mg total) rectally every 6 (six) hours as needed for nausea or vomiting. 08/04/18  Yes Delsa Grana, PA-C  promethazine (PHENERGAN) 12.5 MG tablet Take 1-2 tablets (12.5-25 mg total) by mouth every 6 (six) hours as needed for nausea or vomiting. 08/04/18  Yes Delsa Grana, PA-C  acetaminophen (TYLENOL) 500 MG tablet Take 2 tablets (1,000 mg total) by mouth every 6 (six) hours as needed. 08/24/18  Charlesetta Shanks, MD  dicyclomine (BENTYL) 20 MG tablet Take 1 tablet (20 mg total) by mouth every 6 (six) hours. Prn abdominal cramping Patient not taking: Reported on 08/24/2018 11/19/17   Susy Frizzle, MD  EPIPEN 2-PAK 0.3 MG/0.3ML SOAJ injection Inject 0.3 mg into the muscle once.  01/22/16   [provider]  omeprazole (PRILOSEC) 20 MG capsule Take 1 capsule (20 mg total) by mouth daily. Patient not taking: Reported on 08/24/2018 08/04/18   Delsa Grana, PA-C  omeprazole (PRILOSEC) 20 MG capsule Take 1 capsule (20 mg total) by mouth daily. Patient not taking: Reported on 08/24/2018 08/04/18   Delsa Grana, PA-C    predniSONE (DELTASONE) 20 MG tablet 3 tabs poqday 1-2, 2 tabs poqday 3-4, 1 tab poqday 5-6 Patient not taking: Reported on 08/24/2018 03/17/18   Susy Frizzle, MD    Family History No family history on file.  Social History Social History   Tobacco Use  . Smoking status: Never Smoker  . Smokeless tobacco: Never Used  Substance Use Topics  . Alcohol use: No  . Drug use: No     Allergies   Patient has no known allergies.   Review of Systems Review of Systems 10 Systems reviewed and are negative for acute change except as noted in the HPI.  Physical Exam Updated Vital Signs BP (!) 128/93 (BP Location: Left Arm)   Pulse 81   Temp 97.8 F (36.6 C) (Oral)   Resp 16   Ht 5\' 7"  (1.702 m)   Wt 89.8 kg   SpO2 97%   BMI 31.01 kg/m   Physical Exam  Constitutional: He is oriented to person, place, and time. He appears well-developed and well-nourished. No distress.  Patient sitting up in the stretcher.  He is clinically well in appearance.  Nontoxic.  No respiratory distress.  HENT:  Patient has subtle appearance of some fullness around the angles of the mandible.  Slightly tender to palpation at the parotid on the right side.  Internal oral exam does look slightly full of the on the right buccal mucosa corresponding with mild exterior swelling.  The teeth are in good condition.  No swelling around the dentition.  Posterior or where is widely patent.  No tongue swelling.  Eyes: Pupils are equal, round, and reactive to light. EOM are normal.  Neck:  Patient is neck is supple.  No meningismus no trismus.  No stridor.  To visual inspection and palpation patient seems to have a slight full soft pad in the medial supraclavicular area.  There is no crepitus and no lymphadenopathy.  This could represent fat pad but does have a visible full quality to it  Cardiovascular: Normal rate, regular rhythm, normal heart sounds and intact distal pulses.  Pulmonary/Chest: Effort normal and  breath sounds normal.  Abdominal: Soft. He exhibits no distension. There is no tenderness. There is no guarding.  Musculoskeletal: Normal range of motion. He exhibits no edema or tenderness.  Neurological: He is alert and oriented to person, place, and time. No cranial nerve deficit. He exhibits normal muscle tone. Coordination normal.  Skin: Skin is warm and dry.  Psychiatric: He has a normal mood and affect.     ED Treatments / Results  Labs (all labs ordered are listed, but only abnormal results are displayed) Labs Reviewed  COMPREHENSIVE METABOLIC PANEL - Abnormal; Notable for the following components:      Result Value   Glucose, Bld 100 (*)    Creatinine, Ser  1.42 (*)    ALT 49 (*)    GFR calc non Af Amer 57 (*)    All other components within normal limits  LIPASE, BLOOD  CBC    EKG None  Radiology Ct Soft Tissue Neck W Contrast  Result Date: 08/24/2018 CLINICAL DATA:  Neck swelling for 1 day. EXAM: CT NECK WITH CONTRAST TECHNIQUE: Multidetector CT imaging of the neck was performed using the standard protocol following the bolus administration of intravenous contrast. CONTRAST:  40mL OMNIPAQUE IOHEXOL 300 MG/ML  SOLN COMPARISON:  Sinus CT January 05, 2016 FINDINGS: PHARYNX AND LARYNX: Normal.  Widely patent airway. SALIVARY GLANDS: Mild bilateral submandibular ductal dilatation including dilated Wharton's ducts. Symmetric mild enlargement submandibular glands. No associated inflammatory changes. THYROID: Normal. LYMPH NODES: No lymphadenopathy by CT size criteria. VASCULAR: Normal.  Patent included veins. LIMITED INTRACRANIAL: Normal. VISUALIZED ORBITS: Normal. MASTOIDS AND VISUALIZED PARANASAL SINUSES: Lobulated LEFT maxillary sinus mucosal thickening with mucosal retention cyst. Status post FESS. Mastoid air cells are well aerated. SKELETON: Nonacute. UPPER CHEST: Lung apices are clear. No superior mediastinal lymphadenopathy. OTHER: None. IMPRESSION: 1. No acute process in the  neck. 2. Submandibular sialadenosis with mild duct dilatation, no sialolith. Electronically Signed   By: Elon Alas M.D.   On: 08/24/2018 21:57   Ct Chest W Contrast  Result Date: 08/24/2018 CLINICAL DATA:  47 year old male with shortness of breath. EXAM: CT CHEST WITH CONTRAST TECHNIQUE: Multidetector CT imaging of the chest was performed during intravenous contrast administration. CONTRAST:  25mL OMNIPAQUE IOHEXOL 300 MG/ML  SOLN COMPARISON:  None. FINDINGS: Cardiovascular: There is no cardiomegaly or pericardial effusion. The thoracic aorta and the central pulmonary arteries are unremarkable. Mediastinum/Nodes: There is no hilar or mediastinal adenopathy. The esophagus and the thyroid gland are grossly unremarkable. No mediastinal fluid collection. Lungs/Pleura: The lungs are clear. There is no pleural effusion or pneumothorax. Minimal linear atelectasis/scarring at the right lung base. The central airways are patent. Upper Abdomen: Apparent fatty infiltration of the liver. The visualized upper abdomen is otherwise unremarkable. Musculoskeletal: No chest wall abnormality. No acute or significant osseous findings. IMPRESSION: No acute intrathoracic pathology. Electronically Signed   By: Anner Crete M.D.   On: 08/24/2018 21:57    Procedures Procedures (including critical care time)  Medications Ordered in ED Medications  iopamidol (ISOVUE-300) 61 % injection (has no administration in time range)  iohexol (OMNIPAQUE) 300 MG/ML solution 75 mL (75 mLs Intravenous Contrast Given 08/24/18 2127)     Initial Impression / Assessment and Plan / ED Course  I have reviewed the triage vital signs and the nursing notes.  Pertinent labs & imaging results that were available during my care of the patient were reviewed by me and considered in my medical decision making (see chart for details).    Examined the patient and noting what he perceived to be new fullness around the base of the neck, I  had concern for possible SVC syndrome.  CT does not show any vascular anomaly or compressive lesions.  No lymphadenopathy.  At this time I most suspect that this fullness is fat tissue that the patient had not fully noticed previously.  He does have slight objective fullness at the angle of the mandible and some tenderness on the right.  CT identifies some enlargement of the salivary gland without suspicious lesions or sialolith.  Patient's clinical picture does not suggest mumps and he certainly does not have a really distinct fullness of the parotids, there have however been cases.  Advised  for patient to avoid contact with others.  Follow-up with PCP and watch for additional symptoms.  He is to make his PCP aware of the diagnostic imaging done this evening.  His labs are stable with no concerning elevations in renal function or LFTs.  Final Clinical Impressions(s) / ED Diagnoses   Final diagnoses:  Neck pain  Facial swelling  Salivary gland swelling    ED Discharge Orders         Ordered    acetaminophen (TYLENOL) 500 MG tablet  Every 6 hours PRN     08/24/18 2340           Charlesetta Shanks, MD 08/24/18 2358

## 2018-08-24 NOTE — Telephone Encounter (Signed)
Patient wife called stating patient symptoms has gotten worse since his las office visit on 08/04/2018. Patient is still having abdominal pain, glands are swollen, ears are swollen, and head cold   Wife denies patient having trouble swallowing, patient has been taking promethazine to help with nausea and vomiting. Mrs. Goldie was advised to take patient to ER for evaluation. Mrs. Smithey stated patient has an appointment scheduled for 08/27/2018. I recommended patient be seen today if his symptoms has worsened as we only have 1 provider today and the schedule is full. Mrs.Arana verbalizes understanding

## 2018-08-24 NOTE — ED Triage Notes (Signed)
Patient here from home with complaints of abnormal labs. Reports elevated kidney and liver function from blood draw earlier this month. Was suppose to have a second draw but referred here. Also, reports bilateral neck swelling that started 24 hours ago.

## 2018-08-25 ENCOUNTER — Ambulatory Visit: Payer: BC Managed Care – PPO | Admitting: Family Medicine

## 2018-08-26 ENCOUNTER — Encounter: Payer: Self-pay | Admitting: Family Medicine

## 2018-08-31 ENCOUNTER — Encounter: Payer: Self-pay | Admitting: Family Medicine

## 2018-08-31 ENCOUNTER — Ambulatory Visit: Payer: BC Managed Care – PPO | Admitting: Family Medicine

## 2018-08-31 VITALS — BP 130/78 | HR 86 | Temp 98.1°F | Resp 14 | Ht 67.0 in | Wt 203.0 lb

## 2018-08-31 DIAGNOSIS — K1120 Sialoadenitis, unspecified: Secondary | ICD-10-CM

## 2018-09-01 ENCOUNTER — Encounter: Payer: Self-pay | Admitting: Family Medicine

## 2018-09-01 NOTE — Progress Notes (Signed)
Subjective:    Patient ID: Dustin Solis, male    DOB: [birth year], 47 y.o.   MRN: [id number]  HPI Patient was seen approximately 1 month ago in this office complaining of episodic nausea.  Nausea seem to occur when he would be in a hot room or breathing dry air.  Patient will feel very nauseous and lightheaded.  He would rarely vomit.  Was given Phenergan here at the office.  Then approximately 1 week ago, the patient noticed swelling in the submandibular lymph glands bilaterally.  He went to the emergency room.  In the emergency room a CT scan was obtained of the neck and the chest.  There was mild enlargement of the salivary glands bilaterally.  Otherwise there was no significant findings.  Patient is here today for follow-up.  He states that the swelling in his submandibular glands have improved.  There may be mild enlargement on exam today.  There is no evidence of parotitis.  He denies any testicular pain.  He denies any high fever.  He denies any other symptoms of a virus.  He still endorses occasional nausea.  This seems to come randomly.  It is provoked usually by breathing and dry hot air.  He denies any diarrhea.  He denies any hematemesis.  He denies any weight loss.  He denies any abdominal pain.  He denies any fevers or chills.  Upon further investigation, the patient admits that anxiety provoking situations seems to trigger the nausea.  Recently he had a court hearing regarding his disability that triggered it.  He also states that this episodic nausea has been ongoing for than a year.  However there are no warning signs such as fever or weight loss or wasting. Past Medical History:  Diagnosis Date  . Anxiety   . CKD (chronic kidney disease), stage I   . Depression    Past Surgical History:  Procedure Laterality Date  . NASAL SEPTUM SURGERY  1999   Current Outpatient Medications on File Prior to Visit  Medication Sig Dispense Refill  . acetaminophen (TYLENOL) 500 MG tablet Take 2  tablets (1,000 mg total) by mouth every 6 (six) hours as needed. 30 tablet 0  . amLODipine (NORVASC) 10 MG tablet TAKE 1 TABLET (10 MG TOTAL) BY MOUTH DAILY. 90 tablet 1  . azelastine (ASTELIN) 0.1 % nasal spray PLACE 2 SPRAYS INTO BOTH NOSTRILS 2 (TWO) TIMES DAILY AS DIRECTED (Patient taking differently: Place 1 spray into both nostrils 2 (two) times daily. ) 30 mL 4  . cetirizine (ZYRTEC) 10 MG tablet Take 10 mg by mouth daily.    Marland Kitchen EPIPEN 2-PAK 0.3 MG/0.3ML SOAJ injection Inject 0.3 mg into the muscle once.     . fluticasone (FLONASE) 50 MCG/ACT nasal spray PLACE 2 SPRAYS INTO BOTH NOSTRILS DAILY. (Patient taking differently: Place 2 sprays into both nostrils daily. PLACE 2 SPRAYS INTO BOTH NOSTRILS DAILY.) 48 g 2  . lamoTRIgine (LAMICTAL) 150 MG tablet Take 300 mg by mouth daily.     . montelukast (SINGULAIR) 10 MG tablet Take 10 mg by mouth at bedtime.     . Multiple Vitamin (MULTIVITAMIN) tablet Take 1 tablet by mouth daily.    . Multiple Vitamins-Minerals (MULTIVITAMIN ADULT) TABS Take 1 tablet by mouth daily.    Marland Kitchen omeprazole (PRILOSEC) 20 MG capsule Take 1 capsule (20 mg total) by mouth daily. 30 capsule 3  . paliperidone (INVEGA) 3 MG 24 hr tablet Take 3 mg by mouth every evening.     Marland Kitchen  promethazine (PHENERGAN) 12.5 MG suppository Place 1 suppository (12.5 mg total) rectally every 6 (six) hours as needed for nausea or vomiting. 12 each 0  . promethazine (PHENERGAN) 12.5 MG tablet Take 1-2 tablets (12.5-25 mg total) by mouth every 6 (six) hours as needed for nausea or vomiting. 30 tablet 0   No current facility-administered medications on file prior to visit.    No Known Allergies Social History   Socioeconomic History  . Marital status: Married    Spouse name: Not on file  . Number of children: Not on file  . Years of education: Not on file  . Highest education level: Not on file  Occupational History  . Not on file  Social Needs  . Financial resource strain: Not on file  .  Food insecurity:    Worry: Not on file    Inability: Not on file  . Transportation needs:    Medical: Not on file    Non-medical: Not on file  Tobacco Use  . Smoking status: Never Smoker  . Smokeless tobacco: Never Used  Substance and Sexual Activity  . Alcohol use: No  . Drug use: No  . Sexual activity: Not on file  Lifestyle  . Physical activity:    Days per week: Not on file    Minutes per session: Not on file  . Stress: Not on file  Relationships  . Social connections:    Talks on phone: Not on file    Gets together: Not on file    Attends religious service: Not on file    Active member of club or organization: Not on file    Attends meetings of clubs or organizations: Not on file    Relationship status: Not on file  . Intimate partner violence:    Fear of current or ex partner: Not on file    Emotionally abused: Not on file    Physically abused: Not on file    Forced sexual activity: Not on file  Other Topics Concern  . Not on file  Social History Narrative  . Not on file      Review of Systems  All other systems reviewed and are negative.      Objective:   Physical Exam  Constitutional: He appears well-developed and well-nourished. No distress.  HENT:  Right Ear: External ear normal.  Left Ear: External ear normal.  Nose: Nose normal.  Mouth/Throat: Oropharynx is clear and moist. No oropharyngeal exudate.  Eyes: Conjunctivae are normal. No scleral icterus.  Neck: Neck supple. No JVD present. No thyromegaly present.  Cardiovascular: Normal rate, regular rhythm and normal heart sounds.  No murmur heard. Pulmonary/Chest: Effort normal and breath sounds normal. No stridor. No respiratory distress. He has no wheezes.  Abdominal: Soft. Bowel sounds are normal. He exhibits no distension and no mass. There is no tenderness. There is no rebound and no guarding. No hernia.  Lymphadenopathy:    He has no cervical adenopathy.  Skin: He is not diaphoretic.    Vitals reviewed.         Assessment & Plan:  Sialadenitis  The mild enlargement of his submandibular salivary gland seems to have improved.  He has no warning signs or symptoms that would suggest mumps.  I feel no further work-up is necessary unless the swelling returns.  Regarding his episodic nausea, I believe is most likely anxiety related and psychogenic in nature.  If the nausea worsens, consider a GI consult for evaluation of gastroparesis as well  as possibly a right upper quadrant ultrasound to evaluate for biliary dyskinesia or cholelithiasis.  Patient is instructed to call me immediately if warning signs develop such as fever chills jaundice hematemesis melena hematochezia weight loss or abdominal pain

## 2018-09-08 ENCOUNTER — Other Ambulatory Visit: Payer: Self-pay | Admitting: Family Medicine

## 2018-11-27 ENCOUNTER — Ambulatory Visit: Payer: BC Managed Care – PPO | Admitting: Family Medicine

## 2018-11-27 ENCOUNTER — Encounter: Payer: Self-pay | Admitting: Family Medicine

## 2018-11-27 VITALS — BP 150/100 | HR 90 | Temp 98.4°F | Resp 15 | Ht 67.0 in | Wt 207.2 lb

## 2018-11-27 DIAGNOSIS — K1120 Sialoadenitis, unspecified: Secondary | ICD-10-CM

## 2018-11-27 DIAGNOSIS — J324 Chronic pansinusitis: Secondary | ICD-10-CM | POA: Diagnosis not present

## 2018-11-27 MED ORDER — MECLIZINE HCL 12.5 MG PO TABS: 12.5000 mg | ORAL_TABLET | Freq: Three times a day (TID) | ORAL | 0 refills | Status: DC | PRN

## 2018-11-27 MED ORDER — PREDNISONE 20 MG PO TABS: 40.0000 mg | ORAL_TABLET | Freq: Every day | ORAL | 0 refills | Status: AC

## 2018-11-27 MED ORDER — AMOXICILLIN-POT CLAVULANATE 875-125 MG PO TABS: 1.0000 | ORAL_TABLET | Freq: Two times a day (BID) | ORAL | 0 refills | Status: AC

## 2018-11-27 NOTE — Progress Notes (Signed)
Patient ID: Dustin Solis, male    DOB: [birth year], 48 y.o.   MRN: [id number]  PCP: Susy Frizzle, MD  Chief Complaint  Patient presents with  . Ear Problem    Patient in with c/o of pressure to ears, and swelling to lymph nodes around. Onset 2 weeks ago    Subjective:   [name] is a 48 y.o. male, presents to clinic with CC of swollen lymph nodes, swelling around ears, worsening pressure in ear b/l.  He reports that symptoms started 2 weeks ago but he also says that he has had these symptoms on and off for several months he is additionally seen his PCP in the past couple months for similar issues, had an ER visit and also followed up with ENT for the same constellation of symptoms.  Per the patient, he says ENT dx him with vestibular neuritis dx 2 weeks, he says he is only using over-the-counter Dramamine to treat this.  He saw dr. Dennard Schaumann few months ago for same complaint, was referred to ENT  For 2 weeks pt endorses feeling very very tired, allergies worsening with Light headedness, N, dizziness, sensitive to sound 2 days ago  Peeing normal normal appetite No weight loss No fever, no SOB, CP, no LE edema  Wt Readings from Last 5 Encounters:  11/27/18 207 lb 4 oz (94 kg)  08/31/18 203 lb (92.1 kg)  08/24/18 198 lb (89.8 kg)  08/04/18 201 lb (91.2 kg)  03/17/18 195 lb (88.5 kg)    Patient Active Problem List   Diagnosis Date Noted  . Sinusitis, chronic 01/03/2016  . Chronic allergic rhinitis 01/03/2016  . CKD (chronic kidney disease), stage I   . Cervical strain, acute 02/22/2015     Prior to Admission medications   Medication Sig Start Date End Date Taking? Authorizing Provider  acetaminophen (TYLENOL) 500 MG tablet Take 2 tablets (1,000 mg total) by mouth every 6 (six) hours as needed. 08/24/18  Yes Pfeiffer, Jeannie Done, MD  amLODipine (NORVASC) 10 MG tablet TAKE 1 TABLET (10 MG TOTAL) BY MOUTH DAILY. 09/08/18  Yes [place], Modena Nunnery, MD  azelastine  (ASTELIN) 0.1 % nasal spray PLACE 2 SPRAYS INTO BOTH NOSTRILS 2 (TWO) TIMES DAILY AS DIRECTED Patient taking differently: Place 1 spray into both nostrils 2 (two) times daily.  12/20/16  Yes Susy Frizzle, MD  cetirizine (ZYRTEC) 10 MG tablet Take 10 mg by mouth daily.   Yes [provider]  fluticasone (FLONASE) 50 MCG/ACT nasal spray PLACE 2 SPRAYS INTO BOTH NOSTRILS DAILY. Patient taking differently: Place 2 sprays into both nostrils daily. PLACE 2 SPRAYS INTO BOTH NOSTRILS DAILY. 11/26/17  Yes Susy Frizzle, MD  lamoTRIgine (LAMICTAL) 150 MG tablet Take 300 mg by mouth daily.  02/23/17  Yes [provider]  montelukast (SINGULAIR) 10 MG tablet Take 10 mg by mouth at bedtime.  01/22/16  Yes [provider]  Multiple Vitamin (MULTIVITAMIN) tablet Take 1 tablet by mouth daily.   Yes [provider]  omeprazole (PRILOSEC) 20 MG capsule Take 1 capsule (20 mg total) by mouth daily. 08/04/18  Yes Delsa Grana, PA-C  paliperidone (INVEGA) 3 MG 24 hr tablet Take 3 mg by mouth every evening.  10/30/17  Yes [provider]  EPIPEN 2-PAK 0.3 MG/0.3ML SOAJ injection Inject 0.3 mg into the muscle once.  01/22/16   [provider]  promethazine (PHENERGAN) 12.5 MG suppository Place 1 suppository (12.5 mg total) rectally every 6 (six)  hours as needed for nausea or vomiting. Patient not taking: Reported on 11/27/2018 08/04/18   Delsa Grana, PA-C  promethazine (PHENERGAN) 12.5 MG tablet Take 1-2 tablets (12.5-25 mg total) by mouth every 6 (six) hours as needed for nausea or vomiting. Patient not taking: Reported on 11/27/2018 08/04/18   Delsa Grana, PA-C     No Known Allergies   No family history on file.   Social History   Socioeconomic History  . Marital status: Married    Spouse name: Not on file  . Number of children: Not on file  . Years of education: Not on file  . Highest education level: Not on file  Occupational History  . Not on file    Social Needs  . Financial resource strain: Not on file  . Food insecurity:    Worry: Not on file    Inability: Not on file  . Transportation needs:    Medical: Not on file    Non-medical: Not on file  Tobacco Use  . Smoking status: Never Smoker  . Smokeless tobacco: Never Used  Substance and Sexual Activity  . Alcohol use: No  . Drug use: No  . Sexual activity: Not on file  Lifestyle  . Physical activity:    Days per week: Not on file    Minutes per session: Not on file  . Stress: Not on file  Relationships  . Social connections:    Talks on phone: Not on file    Gets together: Not on file    Attends religious service: Not on file    Active member of club or organization: Not on file    Attends meetings of clubs or organizations: Not on file    Relationship status: Not on file  . Intimate partner violence:    Fear of current or ex partner: Not on file    Emotionally abused: Not on file    Physically abused: Not on file    Forced sexual activity: Not on file  Other Topics Concern  . Not on file  Social History Narrative  . Not on file     Review of Systems  Constitutional: Positive for fatigue. Negative for chills, diaphoresis and fever.  HENT: Positive for sinus pressure and sinus pain. Negative for voice change.   Eyes: Negative.   Respiratory: Negative.   Cardiovascular: Negative.   Gastrointestinal: Positive for nausea. Negative for abdominal pain, constipation, diarrhea and vomiting.  Endocrine: Negative.   Genitourinary: Negative.   Musculoskeletal: Negative.  Negative for neck pain.  Skin: Negative.   Allergic/Immunologic: Negative.   Neurological: Positive for dizziness, light-headedness and headaches. Negative for tremors, seizures, syncope, facial asymmetry, weakness and numbness.  Hematological: Negative.   Psychiatric/Behavioral: Negative.   All other systems reviewed and are negative.      Objective:    Vitals:   11/27/18 1433  BP: (!)  150/100  Pulse: 90  Resp: 15  Temp: 98.4 F (36.9 C)  TempSrc: Oral  SpO2: 99%  Weight: 207 lb 4 oz (94 kg)  Height: 5\' 7"  (1.702 m)      Physical Exam Vitals signs and nursing note reviewed.  Constitutional:      General: He is not in acute distress.    Appearance: Normal appearance. He is well-developed. He is not toxic-appearing or diaphoretic.  HENT:     Head: Normocephalic and atraumatic.     Jaw: No trismus.     Right Ear: Tympanic membrane, ear canal and external ear  normal.     Left Ear: Tympanic membrane, ear canal and external ear normal.     Nose: Mucosal edema, congestion and rhinorrhea present. No nasal deformity.     Right Nostril: Occlusion present.     Right Turbinates: Enlarged.     Left Turbinates: Enlarged.     Right Sinus: Maxillary sinus tenderness and frontal sinus tenderness present.     Left Sinus: Maxillary sinus tenderness and frontal sinus tenderness present.     Mouth/Throat:     Mouth: Mucous membranes are not pale, not dry and not cyanotic.     Pharynx: Uvula midline. Posterior oropharyngeal erythema present. No oropharyngeal exudate or uvula swelling.     Tonsils: No tonsillar exudate or tonsillar abscesses.  Eyes:     General: Lids are normal.        Right eye: No discharge.        Left eye: No discharge.     Conjunctiva/sclera: Conjunctivae normal.     Pupils: Pupils are equal, round, and reactive to light.  Neck:     Musculoskeletal: Normal range of motion and neck supple.     Trachea: Trachea and phonation normal. No tracheal deviation.  Cardiovascular:     Rate and Rhythm: Normal rate and regular rhythm.     Pulses:          Radial pulses are 2+ on the right side and 2+ on the left side.     Heart sounds: Normal heart sounds. No murmur. No friction rub. No gallop.   Pulmonary:     Effort: Pulmonary effort is normal. No tachypnea, accessory muscle usage or respiratory distress.     Breath sounds: Normal breath sounds. No stridor. No  decreased breath sounds, wheezing, rhonchi or rales.  Abdominal:     General: Bowel sounds are normal. There is no distension.     Palpations: Abdomen is soft.     Tenderness: There is no abdominal tenderness.  Musculoskeletal: Normal range of motion.  Lymphadenopathy:     Head:     Right side of head: Submandibular adenopathy present. No submental, tonsillar, preauricular, posterior auricular or occipital adenopathy.     Left side of head: Submandibular adenopathy present. No submental, tonsillar, preauricular, posterior auricular or occipital adenopathy.     Cervical: No cervical adenopathy.  Skin:    General: Skin is warm and dry.     Capillary Refill: Capillary refill takes less than 2 seconds.     Coloration: Skin is not pale.     Findings: No rash.     Nails: There is no clubbing.   Neurological:     Mental Status: He is alert and oriented to person, place, and time.     Motor: No abnormal muscle tone.     Coordination: Coordination normal.     Gait: Gait normal.  Psychiatric:        Speech: Speech normal.        Behavior: Behavior normal. Behavior is cooperative.           Assessment & Plan:    48 y/o male with recurrent symptoms related to chronic allergies, recurrent sinusitis, recurrent vertigo and he continues to have complaints of generalized malaise and fatigue which he is also been seen for many times before.  States that his symptoms acutely worsened 2-week ago    ICD-10-CM   1. Pansinusitis, unspecified chronicity J32.4   2. 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, patient does complain of tenderness and fullness in the same area with the submandibular lymph nodes they are very palpable, he denies any treatment or follow-up with this, will cover with Augmentin he was encouraged to go back to ENT  He has chronic rhinosinusitis he states it is currently  worsening, exam is pertinent for severely enlarged nasal turbinates, tissue is red edematous, congested, Augmentin will cover him for acute on chronic sinusitis, he was encouraged to restart all of his daily maintenance allergy medications even though it is technically wintertime, steroids to help with severe symptoms while he gets back on his maintenance medicines.  Occurs to follow-up with his PCP and with his ENT  Trial of meclizine for vertigo - vertigo not new - likely worse recently due worsening sinusitis and eustachian tube dysfunction  Patient states his E[facility] -cannot see any recent records regarding this    Delsa Grana, PA-C 11/27/18 2:47 PM

## 2018-11-27 NOTE — Patient Instructions (Addendum)
Follow up ENT for sinuses polyp  Follow up here if fatigue isn't better

## 2018-12-04 ENCOUNTER — Encounter: Payer: Self-pay | Admitting: Family Medicine

## 2019-01-07 ENCOUNTER — Other Ambulatory Visit: Payer: Self-pay | Admitting: Family Medicine

## 2019-01-27 ENCOUNTER — Other Ambulatory Visit: Payer: Self-pay | Admitting: Family Medicine

## 2019-04-12 ENCOUNTER — Other Ambulatory Visit: Payer: Self-pay | Admitting: Family Medicine

## 2019-04-12 DIAGNOSIS — R110 Nausea: Secondary | ICD-10-CM

## 2019-04-12 DIAGNOSIS — R140 Abdominal distension (gaseous): Secondary | ICD-10-CM

## 2019-06-02 ENCOUNTER — Other Ambulatory Visit: Payer: Self-pay

## 2019-06-03 ENCOUNTER — Encounter: Payer: Self-pay | Admitting: Family Medicine

## 2019-06-03 ENCOUNTER — Other Ambulatory Visit: Payer: Self-pay

## 2019-06-03 ENCOUNTER — Ambulatory Visit (INDEPENDENT_AMBULATORY_CARE_PROVIDER_SITE_OTHER): Payer: BC Managed Care – PPO | Admitting: Family Medicine

## 2019-06-03 VITALS — BP 124/86 | HR 83 | Temp 98.5°F | Resp 16 | Ht 67.0 in | Wt 209.0 lb

## 2019-06-03 DIAGNOSIS — Z0001 Encounter for general adult medical examination with abnormal findings: Secondary | ICD-10-CM | POA: Diagnosis not present

## 2019-06-03 DIAGNOSIS — N189 Chronic kidney disease, unspecified: Secondary | ICD-10-CM | POA: Insufficient documentation

## 2019-06-03 DIAGNOSIS — Z0000 Encounter for general adult medical examination without abnormal findings: Secondary | ICD-10-CM

## 2019-06-03 DIAGNOSIS — N183 Chronic kidney disease, stage 3 unspecified: Secondary | ICD-10-CM

## 2019-06-03 NOTE — Progress Notes (Signed)
Subjective:    Patient ID: Dustin Solis, male    DOB: 1971-05-03, 48 y.o.   MRN: [id number]  HPI Patient is a very pleasant 48 year old African-American male here today for complete physical exam.  He denies any medical concerns.  He had a colonoscopy about 5 years ago due to symptoms consistent with irritable bowel syndrome.  This was reportedly normal.  Therefore he is not due for a colonoscopy for 5 more years however we discussed that he would like to get the Cologuard this year when he turns 48.  He also has a family history of prostate cancer and he is overdue for a PSA.  His last PSA was 2017.  Otherwise he is doing well.  He has no concerns.  He is started exercising again.  He is running for 30 minutes every day.  He denies any chest pain shortness of breath or dyspnea on exertion.  His blood pressures well controlled today.  His BMI is elevated for his height.  I have recommended weight loss however the patient is started exercising which I believe will be beneficial in achieving that goal. Past Medical History:  Diagnosis Date  . Anxiety   . Chronic kidney disease   . Depression    Past Surgical History:  Procedure Laterality Date  . NASAL SEPTUM SURGERY  1999   Current Outpatient Medications on File Prior to Visit  Medication Sig Dispense Refill  . acetaminophen (TYLENOL) 500 MG tablet Take 2 tablets (1,000 mg total) by mouth every 6 (six) hours as needed. 30 tablet 0  . amLODipine (NORVASC) 10 MG tablet TAKE 1 TABLET (10 MG TOTAL) BY MOUTH DAILY. 90 tablet 1  . azelastine (ASTELIN) 0.1 % nasal spray PLACE 2 SPRAYS INTO BOTH NOSTRILS 2 (TWO) TIMES DAILY AS DIRECTED (Patient taking differently: Place 1 spray into both nostrils 2 (two) times daily. ) 30 mL 4  . cetirizine (ZYRTEC) 10 MG tablet Take 10 mg by mouth daily.    Marland Kitchen EPIPEN 2-PAK 0.3 MG/0.3ML SOAJ injection Inject 0.3 mg into the muscle once.     . fluticasone (FLONASE) 50 MCG/ACT nasal spray Place 2 sprays into both  nostrils daily. PLACE 2 SPRAYS INTO BOTH NOSTRILS DAILY. 48 g 2  . lamoTRIgine (LAMICTAL) 150 MG tablet Take 300 mg by mouth daily.     . meclizine (ANTIVERT) 12.5 MG tablet Take 1 tablet (12.5 mg total) by mouth 3 (three) times daily as needed for dizziness. 30 tablet 0  . montelukast (SINGULAIR) 10 MG tablet Take 10 mg by mouth at bedtime.     . Multiple Vitamin (MULTIVITAMIN) tablet Take 1 tablet by mouth daily.    Marland Kitchen omeprazole (PRILOSEC) 20 MG capsule TAKE 1 CAPSULE BY MOUTH EVERY DAY 90 capsule 0  . paliperidone (INVEGA) 3 MG 24 hr tablet Take 3 mg by mouth every evening.      No current facility-administered medications on file prior to visit.    No Known Allergies Social History   Socioeconomic History  . Marital status: Married    Spouse name: Not on file  . Number of children: Not on file  . Years of education: Not on file  . Highest education level: Not on file  Occupational History  . Not on file  Social Needs  . Financial resource strain: Not on file  . Food insecurity    Worry: Not on file    Inability: Not on file  . Transportation needs    Medical: Not  on file    Non-medical: Not on file  Tobacco Use  . Smoking status: Never Smoker  . Smokeless tobacco: Never Used  Substance and Sexual Activity  . Alcohol use: No  . Drug use: No  . Sexual activity: Not on file  Lifestyle  . Physical activity    Days per week: Not on file    Minutes per session: Not on file  . Stress: Not on file  Relationships  . Social Herbalist on phone: Not on file    Gets together: Not on file    Attends religious service: Not on file    Active member of club or organization: Not on file    Attends meetings of clubs or organizations: Not on file    Relationship status: Not on file  . Intimate partner violence    Fear of current or ex partner: Not on file    Emotionally abused: Not on file    Physically abused: Not on file    Forced sexual activity: Not on file  Other  Topics Concern  . Not on file  Social History Narrative  . Not on file   Family History  Problem Relation Age of Onset  . Cancer Mother        lung  . Cancer Father        prostate cancer  . Hypertension Father   . Hyperlipidemia Father   . Hypertension Brother   . Hyperlipidemia Brother        Review of Systems  All other systems reviewed and are negative.      Objective:   Physical Exam Constitutional:      General: He is not in acute distress.    Appearance: Normal appearance. He is not ill-appearing, toxic-appearing or diaphoretic.  HENT:     Head: Normocephalic and atraumatic.     Right Ear: Tympanic membrane, ear canal and external ear normal. There is no impacted cerumen.     Left Ear: Tympanic membrane, ear canal and external ear normal. There is no impacted cerumen.     Nose: Nose normal. No congestion or rhinorrhea.     Mouth/Throat:     Mouth: Mucous membranes are moist.     Pharynx: No oropharyngeal exudate or posterior oropharyngeal erythema.  Eyes:     General: No scleral icterus.       Right eye: No discharge.        Left eye: No discharge.     Extraocular Movements: Extraocular movements intact.     Conjunctiva/sclera: Conjunctivae normal.     Pupils: Pupils are equal, round, and reactive to light.  Neck:     Musculoskeletal: Normal range of motion and neck supple. No neck rigidity or muscular tenderness.     Vascular: No carotid bruit.  Cardiovascular:     Rate and Rhythm: Normal rate and regular rhythm.     Pulses: Normal pulses.     Heart sounds: Normal heart sounds. No murmur. No friction rub. No gallop.   Pulmonary:     Effort: Pulmonary effort is normal. No respiratory distress.     Breath sounds: Normal breath sounds. No stridor. No wheezing, rhonchi or rales.  Chest:     Chest wall: No tenderness.  Abdominal:     General: Abdomen is flat. Bowel sounds are normal. There is no distension.     Palpations: Abdomen is soft. There is no  mass.     Tenderness: There is no abdominal  tenderness. There is no right CVA tenderness, left CVA tenderness, guarding or rebound.     Hernia: No hernia is present.  Musculoskeletal:        General: No swelling, tenderness or deformity.     Right lower leg: No edema.     Left lower leg: No edema.  Lymphadenopathy:     Cervical: No cervical adenopathy.  Skin:    General: Skin is warm.     Coloration: Skin is not jaundiced or pale.     Findings: No bruising, erythema, lesion or rash.  Neurological:     General: No focal deficit present.     Mental Status: He is alert and oriented to person, place, and time. Mental status is at baseline.     Cranial Nerves: No cranial nerve deficit.     Sensory: No sensory deficit.     Motor: No weakness.     Coordination: Coordination normal.     Gait: Gait normal.     Deep Tendon Reflexes: Reflexes normal.  Psychiatric:        Mood and Affect: Mood normal.        Behavior: Behavior normal.        Thought Content: Thought content normal.        Judgment: Judgment normal.           Assessment & Plan:  The primary encounter diagnosis was General medical exam. A diagnosis of Stage 3 chronic kidney disease ([place]) was also pertinent to this visit. Patient's physical exam is normal today.  His blood pressures well controlled.  I will schedule the patient for Cologuard to update his colon cancer screening.  I will have the patient return fasting for lab work.  This will include a PSA to screen for prostate cancer.  I will also check a CBC to evaluate for anemia or any bone marrow abnormalities.  I will check a CMP to monitor his renal function as well as get a fasting blood sugar.  I will check a fasting lipid panel to monitor his cholesterol.  Otherwise his preventative care is up-to-date.  Regular anticipatory guidance is provided.

## 2019-06-04 ENCOUNTER — Other Ambulatory Visit: Payer: BC Managed Care – PPO

## 2019-06-04 ENCOUNTER — Telehealth: Payer: Self-pay | Admitting: *Deleted

## 2019-06-04 DIAGNOSIS — Z0000 Encounter for general adult medical examination without abnormal findings: Secondary | ICD-10-CM

## 2019-06-04 DIAGNOSIS — N183 Chronic kidney disease, stage 3 unspecified: Secondary | ICD-10-CM

## 2019-06-04 NOTE — Telephone Encounter (Signed)
Received verbal orders for Cologuard.   Order placed via [facility].   Cologuard (Order [id number])

## 2019-06-05 LAB — CBC WITH DIFFERENTIAL/PLATELET
Absolute Monocytes: 352 cells/uL (ref 200–950)
Basophils Absolute: 22 cells/uL (ref 0–200)
Basophils Relative: 0.5 %
Eosinophils Absolute: 48 cells/uL (ref 15–500)
Eosinophils Relative: 1.1 %
HCT: 45.6 % (ref 38.5–50.0)
Hemoglobin: 15.4 g/dL (ref 13.2–17.1)
Lymphs Abs: 1377 cells/uL (ref 850–3900)
MCH: 28.5 pg (ref 27.0–33.0)
MCHC: 33.8 g/dL (ref 32.0–36.0)
MCV: 84.3 fL (ref 80.0–100.0)
MPV: 9.6 fL (ref 7.5–12.5)
Monocytes Relative: 8 %
Neutro Abs: 2600 cells/uL (ref 1500–7800)
Neutrophils Relative %: 59.1 %
Platelets: 400 10*3/uL (ref 140–400)
RBC: 5.41 10*6/uL (ref 4.20–5.80)
RDW: 12 % (ref 11.0–15.0)
Total Lymphocyte: 31.3 %
WBC: 4.4 10*3/uL (ref 3.8–10.8)

## 2019-06-05 LAB — LIPID PANEL
Cholesterol: 161 mg/dL (ref <200)
HDL: 45 mg/dL (ref >=40)
LDL Cholesterol (Calc): 94 mg/dL (calc)
Non-HDL Cholesterol (Calc): 116 mg/dL (calc) (ref <130)
Total CHOL/HDL Ratio: 3.6 (calc) (ref <5.0)
Triglycerides: 128 mg/dL (ref <150)

## 2019-06-05 LAB — COMPLETE METABOLIC PANEL WITH GFR
AG Ratio: 1.9 (calc) (ref 1.0–2.5)
ALT: 43 U/L (ref 9–46)
AST: 40 U/L (ref 10–40)
Albumin: 4.8 g/dL (ref 3.6–5.1)
Alkaline phosphatase (APISO): 111 U/L (ref 36–130)
BUN/Creatinine Ratio: 8 (calc) (ref 6–22)
BUN: 13 mg/dL (ref 7–25)
CO2: 26 mmol/L (ref 20–32)
Calcium: 11 mg/dL — ABNORMAL HIGH (ref 8.6–10.3)
Chloride: 101 mmol/L (ref 98–110)
Creat: 1.64 mg/dL — ABNORMAL HIGH (ref 0.60–1.35)
GFR, Est African American: 56 mL/min/{1.73_m2} — ABNORMAL LOW (ref >=60)
GFR, Est Non African American: 48 mL/min/{1.73_m2} — ABNORMAL LOW (ref >=60)
Globulin: 2.5 g/dL (calc) (ref 1.9–3.7)
Glucose, Bld: 99 mg/dL (ref 65–99)
Potassium: 5.1 mmol/L (ref 3.5–5.3)
Sodium: 136 mmol/L (ref 135–146)
Total Bilirubin: 0.6 mg/dL (ref 0.2–1.2)
Total Protein: 7.3 g/dL (ref 6.1–8.1)

## 2019-06-05 LAB — PSA: PSA: 0.3 ng/mL (ref <=4.0)

## 2019-06-06 ENCOUNTER — Other Ambulatory Visit: Payer: Self-pay | Admitting: Family Medicine

## 2019-06-25 ENCOUNTER — Ambulatory Visit: Payer: BC Managed Care – PPO | Admitting: Family Medicine

## 2019-07-01 ENCOUNTER — Encounter: Payer: Self-pay | Admitting: Family Medicine

## 2019-07-15 ENCOUNTER — Other Ambulatory Visit: Payer: Self-pay | Admitting: Family Medicine

## 2019-07-15 DIAGNOSIS — R140 Abdominal distension (gaseous): Secondary | ICD-10-CM

## 2019-07-15 DIAGNOSIS — R110 Nausea: Secondary | ICD-10-CM

## 2019-07-15 MED ORDER — OMEPRAZOLE 20 MG PO CPDR: 20.0000 mg | DELAYED_RELEASE_CAPSULE | Freq: Every day | ORAL | 3 refills | Status: DC

## 2019-09-28 ENCOUNTER — Encounter: Payer: Self-pay | Admitting: Family Medicine

## 2019-10-02 LAB — COLOGUARD: Cologuard: NEGATIVE

## 2019-10-07 NOTE — Telephone Encounter (Signed)
Received the results of Cologuard screening.   Screening noted negative.   A negative result indicates a low likelihood of colorectal cancer is present. Following a negative Cologuard result, the [employer] recommends a Cologuard re-screening interval of 3 years.

## 2019-10-08 ENCOUNTER — Other Ambulatory Visit: Payer: Self-pay | Admitting: Family Medicine

## 2019-10-16 ENCOUNTER — Other Ambulatory Visit: Payer: Self-pay | Admitting: Family Medicine

## 2019-10-18 ENCOUNTER — Other Ambulatory Visit: Payer: Self-pay

## 2019-10-18 MED ORDER — AZELASTINE HCL 0.1 % NA SOLN: 1.0000 | Freq: Two times a day (BID) | NASAL | 0 refills | Status: DC

## 2019-10-27 ENCOUNTER — Other Ambulatory Visit: Payer: BC Managed Care – PPO

## 2019-10-27 ENCOUNTER — Other Ambulatory Visit: Payer: Self-pay

## 2019-10-27 DIAGNOSIS — N181 Chronic kidney disease, stage 1: Secondary | ICD-10-CM

## 2019-10-28 LAB — COMPLETE METABOLIC PANEL WITH GFR
AG Ratio: 1.6 (calc) (ref 1.0–2.5)
ALT: 53 U/L — ABNORMAL HIGH (ref 9–46)
AST: 44 U/L — ABNORMAL HIGH (ref 10–35)
Albumin: 4.4 g/dL (ref 3.6–5.1)
Alkaline phosphatase (APISO): 104 U/L (ref 35–144)
BUN/Creatinine Ratio: 9 (calc) (ref 6–22)
BUN: 13 mg/dL (ref 7–25)
CO2: 29 mmol/L (ref 20–32)
Calcium: 10 mg/dL (ref 8.6–10.3)
Chloride: 103 mmol/L (ref 98–110)
Creat: 1.45 mg/dL — ABNORMAL HIGH (ref 0.70–1.33)
GFR, Est African American: 65 mL/min/{1.73_m2} (ref >=60)
GFR, Est Non African American: 56 mL/min/{1.73_m2} — ABNORMAL LOW (ref >=60)
Globulin: 2.8 g/dL (calc) (ref 1.9–3.7)
Glucose, Bld: 102 mg/dL — ABNORMAL HIGH (ref 65–99)
Potassium: 4.1 mmol/L (ref 3.5–5.3)
Sodium: 138 mmol/L (ref 135–146)
Total Bilirubin: 0.5 mg/dL (ref 0.2–1.2)
Total Protein: 7.2 g/dL (ref 6.1–8.1)

## 2019-11-11 ENCOUNTER — Other Ambulatory Visit: Payer: Self-pay | Admitting: Family Medicine

## 2020-02-17 ENCOUNTER — Encounter: Payer: Self-pay | Admitting: Family Medicine

## 2020-02-17 ENCOUNTER — Other Ambulatory Visit: Payer: Self-pay

## 2020-02-17 ENCOUNTER — Ambulatory Visit: Payer: BC Managed Care – PPO | Admitting: Family Medicine

## 2020-02-17 VITALS — BP 122/74 | HR 86 | Temp 96.6°F | Resp 14 | Ht 67.0 in | Wt 199.0 lb

## 2020-02-17 DIAGNOSIS — N183 Chronic kidney disease, stage 3 unspecified: Secondary | ICD-10-CM

## 2020-02-17 DIAGNOSIS — R110 Nausea: Secondary | ICD-10-CM

## 2020-02-17 LAB — COMPLETE METABOLIC PANEL WITH GFR
AG Ratio: 1.5 (calc) (ref 1.0–2.5)
ALT: 37 U/L (ref 9–46)
AST: 30 U/L (ref 10–35)
Albumin: 4.5 g/dL (ref 3.6–5.1)
Alkaline phosphatase (APISO): 130 U/L (ref 35–144)
BUN/Creatinine Ratio: 10 (calc) (ref 6–22)
BUN: 16 mg/dL (ref 7–25)
CO2: 29 mmol/L (ref 20–32)
Calcium: 10.8 mg/dL — ABNORMAL HIGH (ref 8.6–10.3)
Chloride: 102 mmol/L (ref 98–110)
Creat: 1.56 mg/dL — ABNORMAL HIGH (ref 0.70–1.33)
GFR, Est African American: 59 mL/min/{1.73_m2} — ABNORMAL LOW (ref >=60)
GFR, Est Non African American: 51 mL/min/{1.73_m2} — ABNORMAL LOW (ref >=60)
Globulin: 3.1 g/dL (calc) (ref 1.9–3.7)
Glucose, Bld: 129 mg/dL — ABNORMAL HIGH (ref 65–99)
Potassium: 4.4 mmol/L (ref 3.5–5.3)
Sodium: 138 mmol/L (ref 135–146)
Total Bilirubin: 0.5 mg/dL (ref 0.2–1.2)
Total Protein: 7.6 g/dL (ref 6.1–8.1)

## 2020-02-17 LAB — CBC WITH DIFFERENTIAL/PLATELET
Absolute Monocytes: 414 cells/uL (ref 200–950)
Basophils Absolute: 42 cells/uL (ref 0–200)
Basophils Relative: 0.9 %
Eosinophils Absolute: 42 cells/uL (ref 15–500)
Eosinophils Relative: 0.9 %
HCT: 45.9 % (ref 38.5–50.0)
Hemoglobin: 15.4 g/dL (ref 13.2–17.1)
Lymphs Abs: 1250 cells/uL (ref 850–3900)
MCH: 28.5 pg (ref 27.0–33.0)
MCHC: 33.6 g/dL (ref 32.0–36.0)
MCV: 84.8 fL (ref 80.0–100.0)
MPV: 9.1 fL (ref 7.5–12.5)
Monocytes Relative: 8.8 %
Neutro Abs: 2952 cells/uL (ref 1500–7800)
Neutrophils Relative %: 62.8 %
Platelets: 423 10*3/uL — ABNORMAL HIGH (ref 140–400)
RBC: 5.41 10*6/uL (ref 4.20–5.80)
RDW: 11.8 % (ref 11.0–15.0)
Total Lymphocyte: 26.6 %
WBC: 4.7 10*3/uL (ref 3.8–10.8)

## 2020-02-17 MED ORDER — ONDANSETRON 4 MG PO TBDP: 4.0000 mg | ORAL_TABLET | Freq: Three times a day (TID) | ORAL | 0 refills | Status: DC | PRN

## 2020-02-17 NOTE — Progress Notes (Signed)
Subjective:    Patient ID: Dustin Solis, male    DOB: 1971-04-26, 49 y.o.   MRN: [id number]  HPI Patient reports nausea. The nausea occurs usually when he is anxious. He states that when he gets scared or upset, he will feel like he needs to throw up. His psychiatrist is having him use hydroxyzine as needed for panic attacks. He is also using antihistamines for allergies such as Zyrtec. He also occasionally uses meclizine for dizziness. I spent a great deal of time today with the patient explaining the risk of polypharmacy. I explained how all these medications are anticholinergic in nature and can cause side effects. Furthermore the Phenergan does not seem to help with the nausea. He denies any abdominal pain. He denies any melena or hematochezia. He denies any hematemesis or coffee-ground emesis. He denies any weight loss. He denies any right upper quadrant abdominal pain. He denies any diarrhea. He denies any constipation. He denies any fevers. He denies any exacerbating foods. He denies any acid reflux or regurgitation. He states that he feels nauseated only when he gets anxious. Past Medical History:  Diagnosis Date  . Anxiety   . Chronic kidney disease   . Depression    Past Surgical History:  Procedure Laterality Date  . NASAL SEPTUM SURGERY  1999   Current Outpatient Medications on File Prior to Visit  Medication Sig Dispense Refill  . acetaminophen (TYLENOL) 500 MG tablet Take 2 tablets (1,000 mg total) by mouth every 6 (six) hours as needed. 30 tablet 0  . amLODipine (NORVASC) 10 MG tablet TAKE 1 TABLET BY MOUTH EVERY DAY 90 tablet 3  . Azelastine HCl 137 MCG/SPRAY SOLN PLACE 1 SPRAY INTO BOTH NOSTRILS 2 (TWO) TIMES DAILY. USE IN EACH NOSTRIL AS DIRECTED 30 mL 5  . budesonide (PULMICORT) 0.5 MG/2ML nebulizer solution Add 1 vial to 234ml of saline in rinse bottle. Irrigate sinuses with 1104ml through each nostril twice daily    . cetirizine (ZYRTEC) 10 MG tablet Take 10 mg by  mouth daily.    . fluticasone (FLONASE) 50 MCG/ACT nasal spray PLACE 2 SPRAYS INTO BOTH NOSTRILS DAILY. PLACE 2 SPRAYS INTO BOTH NOSTRILS DAILY. 48 mL 2  . hydrOXYzine (ATARAX/VISTARIL) 25 MG tablet TAKE 1 2 TABLETS BY MOUTH ONCE A DAY AS NEEDED FOR ANXIETY    . lamoTRIgine (LAMICTAL) 150 MG tablet Take 300 mg by mouth daily.     . montelukast (SINGULAIR) 10 MG tablet Take 10 mg by mouth at bedtime.     . Multiple Vitamin (MULTIVITAMIN) tablet Take 1 tablet by mouth daily.    Marland Kitchen omeprazole (PRILOSEC) 20 MG capsule Take 1 capsule (20 mg total) by mouth daily. 90 capsule 3  . paliperidone (INVEGA) 3 MG 24 hr tablet Take 3 mg by mouth every evening.     Marland Kitchen EPIPEN 2-PAK 0.3 MG/0.3ML SOAJ injection Inject 0.3 mg into the muscle once.     . meclizine (ANTIVERT) 12.5 MG tablet Take 1 tablet (12.5 mg total) by mouth 3 (three) times daily as needed for dizziness. (Patient not taking: Reported on 02/17/2020) 30 tablet 0   No current facility-administered medications on file prior to visit.   No Known Allergies Social History   Socioeconomic History  . Marital status: Married    Spouse name: Not on file  . Number of children: Not on file  . Years of education: Not on file  . Highest education level: Not on file  Occupational History  . Not  on file  Tobacco Use  . Smoking status: Never Smoker  . Smokeless tobacco: Never Used  Substance and Sexual Activity  . Alcohol use: No  . Drug use: No  . Sexual activity: Not on file  Other Topics Concern  . Not on file  Social History Narrative  . Not on file   Social Determinants of Health   Financial Resource Strain:   . Difficulty of Paying Living Expenses:   Food Insecurity:   . Worried About Charity fundraiser in the Last Year:   . Arboriculturist in the Last Year:   Transportation Needs:   . Film/video editor (Medical):   Marland Kitchen Lack of Transportation (Non-Medical):   Physical Activity:   . Days of Exercise per Week:   . Minutes of  Exercise per Session:   Stress:   . Feeling of Stress :   Social Connections:   . Frequency of Communication with Friends and Family:   . Frequency of Social Gatherings with Friends and Family:   . Attends Religious Services:   . Active Member of Clubs or Organizations:   . Attends Archivist Meetings:   Marland Kitchen Marital Status:   Intimate Partner Violence:   . Fear of Current or Ex-Partner:   . Emotionally Abused:   Marland Kitchen Physically Abused:   . Sexually Abused:       Review of Systems  All other systems reviewed and are negative.      Objective:   Physical Exam Vitals reviewed.  Constitutional:      General: He is not in acute distress.    Appearance: He is well-developed. He is not diaphoretic.  HENT:     Right Ear: External ear normal.     Left Ear: External ear normal.     Nose: Mucosal edema present.     Right Sinus: No maxillary sinus tenderness or frontal sinus tenderness.     Left Sinus: No maxillary sinus tenderness or frontal sinus tenderness.  Cardiovascular:     Rate and Rhythm: Normal rate and regular rhythm.     Heart sounds: Normal heart sounds.  Pulmonary:     Effort: Pulmonary effort is normal. No respiratory distress.     Breath sounds: Normal breath sounds. No stridor. No wheezing.  Abdominal:     General: Abdomen is flat. Bowel sounds are normal. There is no distension.     Palpations: Abdomen is soft. There is no mass.     Tenderness: There is no abdominal tenderness. There is no right CVA tenderness, left CVA tenderness, guarding or rebound.     Hernia: No hernia is present.  Musculoskeletal:     Cervical back: Neck supple.  Lymphadenopathy:     Cervical: No cervical adenopathy.           Assessment & Plan:  Stage 3 chronic kidney disease, unspecified whether stage 3a or 3b CKD - Plan: CBC with Differential/Platelet, COMPLETE METABOLIC PANEL WITH GFR  Nausea  I believe his nausea is most likely due to his anxiety. Therefore whether  we call irritable bowel or nausea secondary to anxiety I will treat it symptomatically. Since the Phenergan does not seem to be working as well, we will switch to Zofran 4 mg every 8 hours as needed. I spent explained to the patient how all these medications are anticholinergic in nature and can lead to side effects of taking simultaneously. Therefore I recommended that he avoid using meclizine. I recommended that he  be very cautious in how and when he takes the medication. For instance if he takes hydroxyzine I would not take Zofran with that. If he takes meclizine I would not take hydroxyzine. Etc. Patient understands and will use the medications sparingly and only as needed. While the patient is here we will recheck his kidney function which we are monitoring every 4 months.

## 2020-02-18 ENCOUNTER — Other Ambulatory Visit: Payer: Self-pay | Admitting: Family Medicine

## 2020-02-25 ENCOUNTER — Other Ambulatory Visit: Payer: Self-pay

## 2020-02-25 ENCOUNTER — Other Ambulatory Visit: Payer: BC Managed Care – PPO

## 2020-02-28 LAB — PTH, INTACT AND CALCIUM
Calcium: 10 mg/dL (ref 8.6–10.3)
PTH: 31 pg/mL (ref 14–64)

## 2020-03-02 ENCOUNTER — Ambulatory Visit: Payer: BC Managed Care – PPO | Attending: Internal Medicine

## 2020-03-02 DIAGNOSIS — Z23 Encounter for immunization: Secondary | ICD-10-CM

## 2020-03-02 NOTE — Progress Notes (Signed)
   Covid-19 Vaccination Clinic  Name:  INIOLUWA HARTNESS    MRN: [id number] DOB: Feb 08, 1971  03/02/2020  Mr. Hake was observed post Covid-19 immunization for 15 minutes without incident. He was provided with Vaccine Information Sheet and instruction to access the V-Safe system.   Mr. Nadel was instructed to call 911 with any severe reactions post vaccine: Marland Kitchen Difficulty breathing  . Swelling of face and throat  . A fast heartbeat  . A bad rash all over body  . Dizziness and weakness   Immunizations Administered    Name Date Dose VIS Date Route   Pfizer COVID-19 Vaccine 03/02/2020 10:15 AM 0.3 mL 12/22/2018 Intramuscular   Manufacturer: [employer]   Lot: [id number]   [place]: [id number]

## 2020-03-24 ENCOUNTER — Telehealth: Payer: Self-pay | Admitting: Family Medicine

## 2020-03-24 MED ORDER — METOCLOPRAMIDE HCL 5 MG PO TABS: 5.0000 mg | ORAL_TABLET | Freq: Three times a day (TID) | ORAL | 0 refills | Status: DC | PRN

## 2020-03-24 NOTE — Telephone Encounter (Signed)
CB# [phone] Was prescribe Zofran not helping any is there another medication that can be prescribe nausea ,votiming

## 2020-03-24 NOTE — Telephone Encounter (Signed)
I have sent in reglan 5mg  Stop the zofran if not helping F/U Dr. Dennard Schaumann next week for his ongoing nausea issues

## 2020-03-24 NOTE — Telephone Encounter (Signed)
Call placed to patient and patient made aware.   States that he will try Reglan this weekend to see if it helps. If Sx do not resolve, he will schedule appointment with PCP.

## 2020-03-24 NOTE — Telephone Encounter (Signed)
Dustin Solis, wants to know if can be prescribed something other than Zofran, it's not working stll having vomiting and nausea.

## 2020-03-28 ENCOUNTER — Ambulatory Visit: Payer: BC Managed Care – PPO | Attending: Internal Medicine

## 2020-03-28 DIAGNOSIS — Z23 Encounter for immunization: Secondary | ICD-10-CM

## 2020-03-28 NOTE — Progress Notes (Signed)
   Covid-19 Vaccination Clinic  Name:  Dustin Solis    MRN: [id number] DOB: 24-Apr-1971  03/28/2020  Mr. Dustin Solis was observed post Covid-19 immunization for 15 minutes without incident. He was provided with Vaccine Information Sheet and instruction to access the V-Safe system.   Mr. Dustin Solis was instructed to call 911 with any severe reactions post vaccine: Marland Kitchen Difficulty breathing  . Swelling of face and throat  . A fast heartbeat  . A bad rash all over body  . Dizziness and weakness   Immunizations Administered    Name Date Dose VIS Date Route   Pfizer COVID-19 Vaccine 03/28/2020 10:12 AM 0.3 mL 12/22/2018 Intramuscular   Manufacturer: [employer]   Lot: [id number]   [place]: [id number]      Covid-19 Vaccination Clinic  Name:  Dustin Solis    MRN: [id number] DOB: 04/03/1971  03/28/2020  Mr. Dustin Solis was observed post Covid-19 immunization for 15 minutes without incident. He was provided with Vaccine Information Sheet and instruction to access the V-Safe system.   Mr. Dustin Solis was instructed to call 911 with any severe reactions post vaccine: Marland Kitchen Difficulty breathing  . Swelling of face and throat  . A fast heartbeat  . A bad rash all over body  . Dizziness and weakness   Immunizations Administered    Name Date Dose VIS Date Route   Pfizer COVID-19 Vaccine 03/28/2020 10:12 AM 0.3 mL 12/22/2018 Intramuscular   Manufacturer: [employer]   Lot: [id number]   [place]: [id number]

## 2020-04-18 ENCOUNTER — Ambulatory Visit: Payer: BC Managed Care – PPO | Admitting: Nurse Practitioner

## 2020-04-18 ENCOUNTER — Ambulatory Visit
Admission: RE | Admit: 2020-04-18 | Discharge: 2020-04-18 | Disposition: A | Discharge status: Home / Self Care | Payer: BC Managed Care – PPO | Source: Ambulatory Visit | Attending: Nurse Practitioner | Admitting: Nurse Practitioner

## 2020-04-18 ENCOUNTER — Ambulatory Visit (INDEPENDENT_AMBULATORY_CARE_PROVIDER_SITE_OTHER): Payer: BC Managed Care – PPO | Admitting: Nurse Practitioner

## 2020-04-18 ENCOUNTER — Other Ambulatory Visit: Payer: Self-pay

## 2020-04-18 ENCOUNTER — Encounter: Payer: Self-pay | Admitting: Nurse Practitioner

## 2020-04-18 VITALS — BP 130/82 | HR 77 | Temp 97.6°F | Resp 18 | Wt 201.8 lb

## 2020-04-18 DIAGNOSIS — M25532 Pain in left wrist: Secondary | ICD-10-CM

## 2020-04-18 DIAGNOSIS — S6992XA Unspecified injury of left wrist, hand and finger(s), initial encounter: Secondary | ICD-10-CM

## 2020-04-18 NOTE — Patient Instructions (Signed)
RICE: rest, ice, compression, elevate the affected area Complete xray image of the affected area today and you will be contacted with results and directions based on results You may take NSAID'S such as ibuprofen or Naproxen for inflammation and pain also Voltaren topical gel may help.  Tylenol may help reduce discomfort Seek medical attention for non resolving or worsening of symptoms

## 2020-04-18 NOTE — Progress Notes (Signed)
Established Patient Office Visit  Subjective:  Patient ID: Dustin Solis, male    DOB: 1971-10-23  Age: 49 y.o. MRN: [id number]  CC:  Chief Complaint  Patient presents with  . Wrist Pain    started 06/21, pain from wrist to forearm, ice pack was applied    HPI Dustin Solis is a 48 year old male presenting to clinic with sxs of left thumb and left wrist pain that started one Solis ago. The pain started when he was reaching up for a laundry basket and turning towards his left to catch a door that was opening towards his left and suddenly the laundry basket twisted causing his left wrist and thumb to twist in an award position. The pain started immediately. He has tried no treatments. He denied inability to use or move the wrist or thumb only that is evokes discomfort. He is concerned that he may have a fracture and does desire a xray.   Past Medical History:  Diagnosis Date  . Anxiety   . Chronic kidney disease   . Depression     Past Surgical History:  Procedure Laterality Date  . NASAL SEPTUM SURGERY  1999    Family History  Problem Relation Age of Onset  . Cancer Mother        lung  . Cancer Father        prostate cancer  . Hypertension Father   . Hyperlipidemia Father   . Hypertension Brother   . Hyperlipidemia Brother     Social History   Socioeconomic History  . Marital status: Married    Spouse name: Not on file  . Number of children: Not on file  . Years of education: Not on file  . Highest education level: Not on file  Occupational History  . Not on file  Tobacco Use  . Smoking status: Never Smoker  . Smokeless tobacco: Never Used  Substance and Sexual Activity  . Alcohol use: No  . Drug use: No  . Sexual activity: Not on file  Other Topics Concern  . Not on file  Social History Narrative  . Not on file   Social Determinants of Health   Financial Resource Strain:   . Difficulty of Paying Living Expenses:   Food Insecurity:   . Worried  About Charity fundraiser in the Last Year:   . Arboriculturist in the Last Year:   Transportation Needs:   . Film/video editor (Medical):   Marland Kitchen Lack of Transportation (Non-Medical):   Physical Activity:   . Days of Exercise per Week:   . Minutes of Exercise per Session:   Stress:   . Feeling of Stress :   Social Connections:   . Frequency of Communication with Friends and Family:   . Frequency of Social Gatherings with Friends and Family:   . Attends Religious Services:   . Active Member of Clubs or Organizations:   . Attends Archivist Meetings:   Marland Kitchen Marital Status:   Intimate Partner Violence:   . Fear of Current or Ex-Partner:   . Emotionally Abused:   Marland Kitchen Physically Abused:   . Sexually Abused:     Outpatient Medications Prior to Visit  Medication Sig Dispense Refill  . acetaminophen (TYLENOL) 500 MG tablet Take 2 tablets (1,000 mg total) by mouth every 6 (six) hours as needed. 30 tablet 0  . amLODipine (NORVASC) 10 MG tablet TAKE 1 TABLET BY MOUTH EVERY Solis 90  tablet 3  . Azelastine HCl 137 MCG/SPRAY SOLN PLACE 1 SPRAY INTO BOTH NOSTRILS 2 (TWO) TIMES DAILY. USE IN EACH NOSTRIL AS DIRECTED 30 mL 5  . budesonide (PULMICORT) 0.5 MG/2ML nebulizer solution Add 1 vial to 264ml of saline in rinse bottle. Irrigate sinuses with 125ml through each nostril twice daily    . cetirizine (ZYRTEC) 10 MG tablet Take 10 mg by mouth daily.    Marland Kitchen EPIPEN 2-PAK 0.3 MG/0.3ML SOAJ injection Inject 0.3 mg into the muscle once.     . fluticasone (FLONASE) 50 MCG/ACT nasal spray PLACE 2 SPRAYS INTO BOTH NOSTRILS DAILY. PLACE 2 SPRAYS INTO BOTH NOSTRILS DAILY. 48 mL 2  . hydrOXYzine (ATARAX/VISTARIL) 25 MG tablet TAKE 1 2 TABLETS BY MOUTH ONCE A Solis AS NEEDED FOR ANXIETY    . lamoTRIgine (LAMICTAL) 150 MG tablet Take 300 mg by mouth daily.     . metoCLOPramide (REGLAN) 5 MG tablet Take 1 tablet (5 mg total) by mouth every 8 (eight) hours as needed for nausea. 30 tablet 0  . montelukast  (SINGULAIR) 10 MG tablet Take 10 mg by mouth at bedtime.     . Multiple Vitamin (MULTIVITAMIN) tablet Take 1 tablet by mouth daily.    Marland Kitchen omeprazole (PRILOSEC) 20 MG capsule Take 1 capsule (20 mg total) by mouth daily. 90 capsule 3  . paliperidone (INVEGA) 3 MG 24 hr tablet Take 3 mg by mouth every evening.     . meclizine (ANTIVERT) 12.5 MG tablet Take 1 tablet (12.5 mg total) by mouth 3 (three) times daily as needed for dizziness. (Patient not taking: Reported on 02/17/2020) 30 tablet 0  . ondansetron (ZOFRAN ODT) 4 MG disintegrating tablet Take 1 tablet (4 mg total) by mouth every 8 (eight) hours as needed for nausea or vomiting. 20 tablet 0   No facility-administered medications prior to visit.    No Known Allergies  ROS Review of Systems  All other systems reviewed and are negative.     Objective:    Physical Exam Vitals and nursing note reviewed.  Constitutional:      General: He is not in acute distress.    Appearance: Normal appearance. He is not ill-appearing.  HENT:     Head: Normocephalic.     Right Ear: External ear normal.     Left Ear: External ear normal.  Eyes:     Extraocular Movements: Extraocular movements intact.     Conjunctiva/sclera: Conjunctivae normal.     Pupils: Pupils are equal, round, and reactive to light.  Pulmonary:     Effort: Pulmonary effort is normal.  Musculoskeletal:     Left wrist: Tenderness present. No swelling, deformity, effusion, lacerations, bony tenderness, snuff box tenderness or crepitus. Normal range of motion. Normal pulse.       Hands:     Comments: Above drawn area discomfort with extension and flexion  Skin:    General: Skin is warm and dry.     Capillary Refill: Capillary refill takes less than 2 seconds.     Findings: No bruising or erythema.  Neurological:     General: No focal deficit present.     Mental Status: He is alert and oriented to person, place, and time.  Psychiatric:        Attention and Perception:  Attention normal.        Mood and Affect: Mood normal.        Speech: Speech normal.        Behavior: Behavior normal.  Behavior is cooperative.        Thought Content: Thought content normal.        Cognition and Memory: Cognition normal.        Judgment: Judgment normal.     BP 130/82 (BP Location: Left Arm, Patient Position: Sitting, Cuff Size: Normal)   Pulse 77   Temp 97.6 F (36.4 C) (Temporal)   Resp 18   Wt 201 lb 12.8 oz (91.5 kg)   SpO2 98%   BMI 31.61 kg/m  Wt Readings from Last 3 Encounters:  04/18/20 201 lb 12.8 oz (91.5 kg)  02/17/20 199 lb (90.3 kg)  06/03/19 209 lb (94.8 kg)     Health Maintenance Due  Topic Date Due  . Hepatitis C Screening  Never done  . HIV Screening  Never done    There are no preventive care reminders to display for this patient.  Lab Results  Component Value Date   TSH 1.03 05/07/2016   Lab Results  Component Value Date   WBC 4.7 02/17/2020   HGB 15.4 02/17/2020   HCT 45.9 02/17/2020   MCV 84.8 02/17/2020   PLT 423 (H) 02/17/2020   Lab Results  Component Value Date   NA 138 02/17/2020   K 4.4 02/17/2020   CO2 29 02/17/2020   GLUCOSE 129 (H) 02/17/2020   BUN 16 02/17/2020   CREATININE 1.56 (H) 02/17/2020   BILITOT 0.5 02/17/2020   ALKPHOS 126 08/24/2018   AST 30 02/17/2020   ALT 37 02/17/2020   PROT 7.6 02/17/2020   ALBUMIN 4.5 08/24/2018   CALCIUM 10.0 02/25/2020   ANIONGAP 9 08/24/2018   Lab Results  Component Value Date   CHOL 161 06/04/2019   Lab Results  Component Value Date   HDL 45 06/04/2019   Lab Results  Component Value Date   LDLCALC 94 06/04/2019   Lab Results  Component Value Date   TRIG 128 06/04/2019   Lab Results  Component Value Date   CHOLHDL 3.6 06/04/2019   No results found for: HGBA1C    Assessment & Plan:  Most likely you have soft tissue swelling and self treatment as described will manage your sxs until healed. However will complete Xray considering the acute mechanism  of injury.   RICE: rest, ice, compression, elevate the affected area Complete xray image of the affected area today and you will be contacted with results and directions based on results You may take NSAID'S such as ibuprofen or Naproxen for inflammation and pain also Voltaren topical gel may help.  Tylenol may help reduce discomfort Seek medical attention for non resolving or worsening of symptoms  Problem List Items Addressed This Visit    None    Visit Diagnoses    Left wrist pain    -  Primary   Relevant Orders   DG Hand Complete Left   Injury of left wrist, initial encounter       Relevant Orders   DG Hand Complete Left   Injury of left thumb, initial encounter       Relevant Orders   DG Hand Complete Left    RICE print out    Follow-up: Return if symptoms worsen or fail to improve.    Annie Main, FNP

## 2020-04-20 NOTE — Progress Notes (Signed)
IMPRESSION: Left hand xray No acute osseous abnormality.  Treatment plan for soft tissue swelling and ligament strain as discussed yesterday

## 2020-05-03 ENCOUNTER — Other Ambulatory Visit: Payer: Self-pay | Admitting: Family Medicine

## 2020-05-05 ENCOUNTER — Other Ambulatory Visit: Payer: Self-pay | Admitting: Family Medicine

## 2020-06-30 ENCOUNTER — Other Ambulatory Visit: Payer: BC Managed Care – PPO

## 2020-06-30 ENCOUNTER — Other Ambulatory Visit: Payer: Self-pay

## 2020-06-30 DIAGNOSIS — N183 Chronic kidney disease, stage 3 unspecified: Secondary | ICD-10-CM

## 2020-06-30 DIAGNOSIS — Z125 Encounter for screening for malignant neoplasm of prostate: Secondary | ICD-10-CM

## 2020-06-30 DIAGNOSIS — Z13220 Encounter for screening for lipoid disorders: Secondary | ICD-10-CM

## 2020-07-01 LAB — COMPLETE METABOLIC PANEL WITH GFR
AG Ratio: 1.6 (calc) (ref 1.0–2.5)
ALT: 33 U/L (ref 9–46)
AST: 35 U/L (ref 10–35)
Albumin: 4.7 g/dL (ref 3.6–5.1)
Alkaline phosphatase (APISO): 100 U/L (ref 35–144)
BUN/Creatinine Ratio: 9 (calc) (ref 6–22)
BUN: 15 mg/dL (ref 7–25)
CO2: 26 mmol/L (ref 20–32)
Calcium: 10.7 mg/dL — ABNORMAL HIGH (ref 8.6–10.3)
Chloride: 101 mmol/L (ref 98–110)
Creat: 1.64 mg/dL — ABNORMAL HIGH (ref 0.70–1.33)
GFR, Est African American: 56 mL/min/{1.73_m2} — ABNORMAL LOW (ref >=60)
GFR, Est Non African American: 48 mL/min/{1.73_m2} — ABNORMAL LOW (ref >=60)
Globulin: 3 g/dL (calc) (ref 1.9–3.7)
Glucose, Bld: 105 mg/dL — ABNORMAL HIGH (ref 65–99)
Potassium: 5 mmol/L (ref 3.5–5.3)
Sodium: 138 mmol/L (ref 135–146)
Total Bilirubin: 0.4 mg/dL (ref 0.2–1.2)
Total Protein: 7.7 g/dL (ref 6.1–8.1)

## 2020-07-01 LAB — LIPID PANEL
Cholesterol: 171 mg/dL (ref <200)
HDL: 49 mg/dL (ref >=40)
LDL Cholesterol (Calc): 100 mg/dL (calc) — ABNORMAL HIGH
Non-HDL Cholesterol (Calc): 122 mg/dL (calc) (ref <130)
Total CHOL/HDL Ratio: 3.5 (calc) (ref <5.0)
Triglycerides: 121 mg/dL (ref <150)

## 2020-07-01 LAB — CBC WITH DIFFERENTIAL/PLATELET
Absolute Monocytes: 270 cells/uL (ref 200–950)
Basophils Absolute: 19 cells/uL (ref 0–200)
Basophils Relative: 0.5 %
Eosinophils Absolute: 19 cells/uL (ref 15–500)
Eosinophils Relative: 0.5 %
HCT: 45.8 % (ref 38.5–50.0)
Hemoglobin: 15.5 g/dL (ref 13.2–17.1)
Lymphs Abs: 1436 cells/uL (ref 850–3900)
MCH: 28.9 pg (ref 27.0–33.0)
MCHC: 33.8 g/dL (ref 32.0–36.0)
MCV: 85.4 fL (ref 80.0–100.0)
MPV: 9.4 fL (ref 7.5–12.5)
Monocytes Relative: 7.1 %
Neutro Abs: 2056 cells/uL (ref 1500–7800)
Neutrophils Relative %: 54.1 %
Platelets: 373 10*3/uL (ref 140–400)
RBC: 5.36 10*6/uL (ref 4.20–5.80)
RDW: 11.6 % (ref 11.0–15.0)
Total Lymphocyte: 37.8 %
WBC: 3.8 10*3/uL (ref 3.8–10.8)

## 2020-07-01 LAB — PSA: PSA: 0.3 ng/mL (ref <=4.0)

## 2020-07-05 ENCOUNTER — Other Ambulatory Visit: Payer: Self-pay | Admitting: Family Medicine

## 2020-07-05 DIAGNOSIS — R110 Nausea: Secondary | ICD-10-CM

## 2020-07-05 DIAGNOSIS — R140 Abdominal distension (gaseous): Secondary | ICD-10-CM

## 2020-08-07 ENCOUNTER — Other Ambulatory Visit: Payer: Self-pay | Admitting: Family Medicine

## 2020-08-07 DIAGNOSIS — J320 Chronic maxillary sinusitis: Secondary | ICD-10-CM

## 2020-08-07 NOTE — Telephone Encounter (Signed)
Fax from [facility] on [facility]  Refill request for fluticasone (FLONASE) 50 MCG/ACT nasal spray Sig: PLACE 2 SPRAYS INTO BOTH NOSTRILS DAILY. PLACE 2 SPRAYS INTO BOTH NOSTRILS DAILY QTY 48 ml

## 2020-08-09 MED ORDER — FLUTICASONE PROPIONATE 50 MCG/ACT NA SUSP: 2.0000 | Freq: Every day | NASAL | 2 refills | Status: DC

## 2020-11-15 ENCOUNTER — Other Ambulatory Visit: Payer: Self-pay | Admitting: Family Medicine

## 2020-12-13 ENCOUNTER — Other Ambulatory Visit: Payer: Self-pay

## 2020-12-13 ENCOUNTER — Other Ambulatory Visit: Payer: BC Managed Care – PPO

## 2020-12-27 ENCOUNTER — Other Ambulatory Visit: Payer: Self-pay

## 2020-12-27 ENCOUNTER — Other Ambulatory Visit: Payer: BC Managed Care – PPO

## 2020-12-28 LAB — CBC WITH DIFFERENTIAL/PLATELET
Absolute Monocytes: 475 cells/uL (ref 200–950)
Basophils Absolute: 19 cells/uL (ref 0–200)
Basophils Relative: 0.4 %
Eosinophils Absolute: 72 cells/uL (ref 15–500)
Eosinophils Relative: 1.5 %
HCT: 43.3 % (ref 38.5–50.0)
Hemoglobin: 14.9 g/dL (ref 13.2–17.1)
Lymphs Abs: 1570 cells/uL (ref 850–3900)
MCH: 29.2 pg (ref 27.0–33.0)
MCHC: 34.4 g/dL (ref 32.0–36.0)
MCV: 84.7 fL (ref 80.0–100.0)
MPV: 9.1 fL (ref 7.5–12.5)
Monocytes Relative: 9.9 %
Neutro Abs: 2664 cells/uL (ref 1500–7800)
Neutrophils Relative %: 55.5 %
Platelets: 399 10*3/uL (ref 140–400)
RBC: 5.11 10*6/uL (ref 4.20–5.80)
RDW: 12.1 % (ref 11.0–15.0)
Total Lymphocyte: 32.7 %
WBC: 4.8 10*3/uL (ref 3.8–10.8)

## 2020-12-28 LAB — LIPID PANEL
Cholesterol: 156 mg/dL (ref <200)
HDL: 47 mg/dL (ref >=40)
LDL Cholesterol (Calc): 85 mg/dL (calc)
Non-HDL Cholesterol (Calc): 109 mg/dL (calc) (ref <130)
Total CHOL/HDL Ratio: 3.3 (calc) (ref <5.0)
Triglycerides: 142 mg/dL (ref <150)

## 2020-12-28 LAB — COMPLETE METABOLIC PANEL WITH GFR
AG Ratio: 1.6 (calc) (ref 1.0–2.5)
ALT: 35 U/L (ref 9–46)
AST: 36 U/L — ABNORMAL HIGH (ref 10–35)
Albumin: 4.4 g/dL (ref 3.6–5.1)
Alkaline phosphatase (APISO): 110 U/L (ref 35–144)
BUN/Creatinine Ratio: 11 (calc) (ref 6–22)
BUN: 16 mg/dL (ref 7–25)
CO2: 27 mmol/L (ref 20–32)
Calcium: 10.5 mg/dL — ABNORMAL HIGH (ref 8.6–10.3)
Chloride: 103 mmol/L (ref 98–110)
Creat: 1.51 mg/dL — ABNORMAL HIGH (ref 0.70–1.33)
GFR, Est African American: 61 mL/min/{1.73_m2} (ref >=60)
GFR, Est Non African American: 53 mL/min/{1.73_m2} — ABNORMAL LOW (ref >=60)
Globulin: 2.8 g/dL (calc) (ref 1.9–3.7)
Glucose, Bld: 96 mg/dL (ref 65–99)
Potassium: 4.4 mmol/L (ref 3.5–5.3)
Sodium: 139 mmol/L (ref 135–146)
Total Bilirubin: 0.5 mg/dL (ref 0.2–1.2)
Total Protein: 7.2 g/dL (ref 6.1–8.1)

## 2020-12-28 LAB — HEPATITIS C ANTIBODY
Hepatitis C Ab: NONREACTIVE
SIGNAL TO CUT-OFF: 0.01 (ref <1.00)

## 2020-12-28 LAB — HEMOGLOBIN A1C
Hgb A1c MFr Bld: 5.5 % of total Hgb (ref <5.7)
Mean Plasma Glucose: 111 mg/dL
eAG (mmol/L): 6.2 mmol/L

## 2021-01-04 ENCOUNTER — Other Ambulatory Visit: Payer: BC Managed Care – PPO

## 2021-01-10 ENCOUNTER — Other Ambulatory Visit: Payer: Self-pay

## 2021-01-10 ENCOUNTER — Encounter: Payer: Self-pay | Admitting: Family Medicine

## 2021-01-10 ENCOUNTER — Ambulatory Visit: Payer: BC Managed Care – PPO | Admitting: Family Medicine

## 2021-01-10 VITALS — BP 118/66 | HR 90 | Temp 98.5°F | Resp 16 | Ht 67.0 in | Wt 206.0 lb

## 2021-01-10 DIAGNOSIS — N183 Chronic kidney disease, stage 3 unspecified: Secondary | ICD-10-CM | POA: Diagnosis not present

## 2021-01-10 DIAGNOSIS — Z1211 Encounter for screening for malignant neoplasm of colon: Secondary | ICD-10-CM

## 2021-01-10 DIAGNOSIS — Z0001 Encounter for general adult medical examination with abnormal findings: Secondary | ICD-10-CM

## 2021-01-10 DIAGNOSIS — Z0000 Encounter for general adult medical examination without abnormal findings: Secondary | ICD-10-CM

## 2021-01-10 NOTE — Progress Notes (Signed)
Subjective:    Patient ID: Dustin Solis, male    DOB: [birth year], 50 y.o.   MRN: [id number]  HPI Patient is a very pleasant 50 year old African-American male here today for complete physical exam.   No visits with results within 1 Month(s) from this visit.  Latest known visit with results is:  Lab on 12/13/2020  Component Date Value Ref Range Status  . Cholesterol 12/27/2020 156  <200 mg/dL Final  . HDL 12/27/2020 47  > OR = 40 mg/dL Final  . Triglycerides 12/27/2020 142  <150 mg/dL Final  . LDL Cholesterol (Calc) 12/27/2020 85  mg/dL (calc) Final   Comment: Reference range: <100 . Desirable range <100 mg/dL for primary prevention;   <70 mg/dL for patients with CHD or diabetic patients  with > or = 2 CHD risk factors. Marland Kitchen LDL-C is now calculated using the Martin-Hopkins  calculation, which is a validated novel method providing  better accuracy than the Friedewald equation in the  estimation of LDL-C.  Cresenciano Genre et al. Annamaria Helling. 8242;353(61): 2061-2068  (http://education.[facility].com/faq/FAQ164)   . Total CHOL/HDL Ratio 12/27/2020 3.3  <5.0 (calc) Final  . Non-HDL Cholesterol (Calc) 12/27/2020 109  <130 mg/dL (calc) Final   Comment: For patients with diabetes plus 1 major ASCVD risk  factor, treating to a non-HDL-C goal of <100 mg/dL  (LDL-C of <70 mg/dL) is considered a therapeutic  option.   . Hgb A1c MFr Bld 12/27/2020 5.5  <5.7 % of total Hgb Final   Comment: For the purpose of screening for the presence of diabetes: . <5.7%       Consistent with the absence of diabetes 5.7-6.4%    Consistent with increased risk for diabetes             (prediabetes) > or =6.5%  Consistent with diabetes . This assay result is consistent with a decreased risk of diabetes. . Currently, no consensus exists regarding use of hemoglobin A1c for diagnosis of diabetes in children. . According to American Diabetes Association (ADA) guidelines, hemoglobin A1c <7.0% represents  optimal control in non-pregnant diabetic patients. Different metrics may apply to specific patient populations.  Standards of Medical Care in Diabetes(ADA). .   . Mean Plasma Glucose 12/27/2020 111  mg/dL Final  . eAG (mmol/L) 12/27/2020 6.2  mmol/L Final  . Glucose, Bld 12/27/2020 96  65 - 99 mg/dL Final   Comment: .            Fasting reference interval .   . BUN 12/27/2020 16  7 - 25 mg/dL Final  . Creat 12/27/2020 1.51* 0.70 - 1.33 mg/dL Final   Comment: For patients >61 years of age, the reference limit for Creatinine is approximately 13% higher for people identified as African-American. .   . GFR, Est Non African American 12/27/2020 53* > OR = 60 mL/min/1.14m2 Final  . GFR, Est African American 12/27/2020 61  > OR = 60 mL/min/1.59m2 Final  . BUN/Creatinine Ratio 12/27/2020 11  6 - 22 (calc) Final  . Sodium 12/27/2020 139  135 - 146 mmol/L Final  . Potassium 12/27/2020 4.4  3.5 - 5.3 mmol/L Final  . Chloride 12/27/2020 103  98 - 110 mmol/L Final  . CO2 12/27/2020 27  20 - 32 mmol/L Final  . Calcium 12/27/2020 10.5* 8.6 - 10.3 mg/dL Final  . Total Protein 12/27/2020 7.2  6.1 - 8.1 g/dL Final  . Albumin 12/27/2020 4.4  3.6 - 5.1 g/dL Final  . Globulin 12/27/2020 2.8  1.9 -  3.7 g/dL (calc) Final  . AG Ratio 12/27/2020 1.6  1.0 - 2.5 (calc) Final  . Total Bilirubin 12/27/2020 0.5  0.2 - 1.2 mg/dL Final  . Alkaline phosphatase (APISO) 12/27/2020 110  35 - 144 U/L Final  . AST 12/27/2020 36* 10 - 35 U/L Final  . ALT 12/27/2020 35  9 - 46 U/L Final  . WBC 12/27/2020 4.8  3.8 - 10.8 Thousand/uL Final  . RBC 12/27/2020 5.11  4.20 - 5.80 Million/uL Final  . Hemoglobin 12/27/2020 14.9  13.2 - 17.1 g/dL Final  . HCT 12/27/2020 43.3  38.5 - 50.0 % Final  . MCV 12/27/2020 84.7  80.0 - 100.0 fL Final  . MCH 12/27/2020 29.2  27.0 - 33.0 pg Final  . MCHC 12/27/2020 34.4  32.0 - 36.0 g/dL Final  . RDW 12/27/2020 12.1  11.0 - 15.0 % Final  . Platelets 12/27/2020 399  140 - 400  Thousand/uL Final  . MPV 12/27/2020 9.1  7.5 - 12.5 fL Final  . Neutro Abs 12/27/2020 2,664  1,500 - 7,800 cells/uL Final  . Lymphs Abs 12/27/2020 1,570  850 - 3,900 cells/uL Final  . Absolute Monocytes 12/27/2020 475  200 - 950 cells/uL Final  . Eosinophils Absolute 12/27/2020 72  15 - 500 cells/uL Final  . Basophils Absolute 12/27/2020 19  0 - 200 cells/uL Final  . Neutrophils Relative % 12/27/2020 55.5  % Final  . Total Lymphocyte 12/27/2020 32.7  % Final  . Monocytes Relative 12/27/2020 9.9  % Final  . Eosinophils Relative 12/27/2020 1.5  % Final  . Basophils Relative 12/27/2020 0.4  % Final  . Hepatitis C Ab 12/27/2020 NON-REACTIVE  NON-REACTI Final  . SIGNAL TO CUT-OFF 12/27/2020 0.01  <1.00 Final   Comment: . HCV antibody was non-reactive. There is no laboratory  evidence of HCV infection. . In most cases, no further action is required. However, if recent HCV exposure is suspected, a test for HCV RNA (test code [id number]) is suggested. . For additional information please refer to http://education.[facility].com/faq/FAQ22v1 (This link is being provided for informational/ educational purposes only.) .    Patient is due for colonoscopy.  He also reports a split urinary stream at times.  At other times it occasionally will have a corkscrew pattern.  At other times is completely normal.  He denies any hematuria.  He denies any dysuria.  He has a history of chronic kidney disease.  His creatinine has remained stable.  He is due to check a urine albumin to creatinine ratio.  His most recent lab work is listed above.  He denies any chest pain shortness of breath or dyspnea on exertion.  His blood pressure is excellent Past Medical History:  Diagnosis Date  . Anxiety   . Chronic kidney disease   . Depression    Past Surgical History:  Procedure Laterality Date  . NASAL SEPTUM SURGERY  1999   Current Outpatient Medications on File Prior to Visit  Medication Sig Dispense Refill   . acetaminophen (TYLENOL) 500 MG tablet Take 2 tablets (1,000 mg total) by mouth every 6 (six) hours as needed. 30 tablet 0  . amLODipine (NORVASC) 10 MG tablet TAKE 1 TABLET BY MOUTH EVERY DAY 270 tablet 1  . azelastine (ASTELIN) 0.1 % nasal spray PLACE 1 SPRAY INTO BOTH NOSTRILS 2 (TWO) TIMES DAILY. USE IN EACH NOSTRIL AS DIRECTED 30 mL 1  . cetirizine (ZYRTEC) 10 MG tablet Take 10 mg by mouth daily.    Marland Kitchen EPIPEN  2-PAK 0.3 MG/0.3ML SOAJ injection Inject 0.3 mg into the muscle once.     . fluticasone (FLONASE) 50 MCG/ACT nasal spray Place 2 sprays into both nostrils daily. PLACE 2 SPRAYS INTO BOTH NOSTRILS DAILY. 48 mL 2  . hydrOXYzine (ATARAX/VISTARIL) 25 MG tablet TAKE 1 2 TABLETS BY MOUTH ONCE A DAY AS NEEDED FOR ANXIETY    . lamoTRIgine (LAMICTAL) 150 MG tablet Take 300 mg by mouth daily.     . montelukast (SINGULAIR) 10 MG tablet Take 10 mg by mouth at bedtime.     . Multiple Vitamin (MULTIVITAMIN) tablet Take 1 tablet by mouth daily.    Marland Kitchen omeprazole (PRILOSEC) 20 MG capsule TAKE 1 CAPSULE BY MOUTH EVERY DAY 90 capsule 3  . paliperidone (INVEGA) 6 MG 24 hr tablet Take 6 mg by mouth at bedtime.    Marland Kitchen QUEtiapine (SEROQUEL) 25 MG tablet Take 12.5-25 mg by mouth at bedtime.     No current facility-administered medications on file prior to visit.   No Known Allergies Social History   Socioeconomic History  . Marital status: Married    Spouse name: Not on file  . Number of children: Not on file  . Years of education: Not on file  . Highest education level: Not on file  Occupational History  . Not on file  Tobacco Use  . Smoking status: Never Smoker  . Smokeless tobacco: Never Used  Substance and Sexual Activity  . Alcohol use: No  . Drug use: No  . Sexual activity: Not on file  Other Topics Concern  . Not on file  Social History Narrative  . Not on file   Social Determinants of Health   Financial Resource Strain: Not on file  Food Insecurity: Not on file  Transportation  Needs: Not on file  Physical Activity: Not on file  Stress: Not on file  Social Connections: Not on file  Intimate Partner Violence: Not on file   Family History  Problem Relation Age of Onset  . Cancer Mother        lung  . Cancer Father        prostate cancer  . Hypertension Father   . Hyperlipidemia Father   . Hypertension Brother   . Hyperlipidemia Brother        Review of Systems  All other systems reviewed and are negative.      Objective:   Physical Exam Constitutional:      General: He is not in acute distress.    Appearance: Normal appearance. He is not ill-appearing, toxic-appearing or diaphoretic.  HENT:     Head: Normocephalic and atraumatic.     Right Ear: Tympanic membrane, ear canal and external ear normal. There is no impacted cerumen.     Left Ear: Tympanic membrane, ear canal and external ear normal. There is no impacted cerumen.     Nose: Nose normal. No congestion or rhinorrhea.     Mouth/Throat:     Mouth: Mucous membranes are moist.     Pharynx: No oropharyngeal exudate or posterior oropharyngeal erythema.  Eyes:     General: No scleral icterus.       Right eye: No discharge.        Left eye: No discharge.     Extraocular Movements: Extraocular movements intact.     Conjunctiva/sclera: Conjunctivae normal.     Pupils: Pupils are equal, round, and reactive to light.  Neck:     Vascular: No carotid bruit.  Cardiovascular:  Rate and Rhythm: Normal rate and regular rhythm.     Pulses: Normal pulses.     Heart sounds: Normal heart sounds. No murmur heard. No friction rub. No gallop.   Pulmonary:     Effort: Pulmonary effort is normal. No respiratory distress.     Breath sounds: Normal breath sounds. No stridor. No wheezing, rhonchi or rales.  Chest:     Chest wall: No tenderness.  Abdominal:     General: Abdomen is flat. Bowel sounds are normal. There is no distension.     Palpations: Abdomen is soft. There is no mass.     Tenderness:  There is no abdominal tenderness. There is no right CVA tenderness, left CVA tenderness, guarding or rebound.     Hernia: No hernia is present.  Musculoskeletal:        General: No swelling, tenderness or deformity.     Cervical back: Normal range of motion and neck supple. No rigidity. No muscular tenderness.     Right lower leg: No edema.     Left lower leg: No edema.  Lymphadenopathy:     Cervical: No cervical adenopathy.  Skin:    General: Skin is warm.     Coloration: Skin is not jaundiced or pale.     Findings: No bruising, erythema, lesion or rash.  Neurological:     General: No focal deficit present.     Mental Status: He is alert and oriented to person, place, and time. Mental status is at baseline.     Cranial Nerves: No cranial nerve deficit.     Sensory: No sensory deficit.     Motor: No weakness.     Coordination: Coordination normal.     Gait: Gait normal.     Deep Tendon Reflexes: Reflexes normal.  Psychiatric:        Mood and Affect: Mood normal.        Behavior: Behavior normal.        Thought Content: Thought content normal.        Judgment: Judgment normal.           Assessment & Plan:  Stage 3 chronic kidney disease, unspecified whether stage 3a or 3b CKD ([place]) - Plan: Microalbumin, urine  General medical exam  Colon cancer screening - Plan: Ambulatory referral to Gastroenterology  Physical exam today is normal.  Blood pressure is excellent.  Schedule colonoscopy.  Check albumin to creatinine ratio and if greater then 30, consider adding an ACE inhibitor.  PSA is excellent.  Cholesterol is excellent.  Immunizations are up-to-date.  Patient declines a referral to urologist to discuss treatment for urethral stricture.

## 2021-01-11 LAB — MICROALBUMIN, URINE: Microalb, Ur: <0.2 mg/dL

## 2021-01-16 ENCOUNTER — Encounter: Payer: Self-pay | Admitting: *Deleted

## 2021-02-14 ENCOUNTER — Ambulatory Visit: Payer: BC Managed Care – PPO | Admitting: Family Medicine

## 2021-05-02 ENCOUNTER — Other Ambulatory Visit: Payer: Self-pay | Admitting: Family Medicine

## 2021-05-25 ENCOUNTER — Other Ambulatory Visit: Payer: Self-pay | Admitting: Family Medicine

## 2021-06-14 ENCOUNTER — Other Ambulatory Visit: Payer: Self-pay | Admitting: Family Medicine

## 2021-06-14 DIAGNOSIS — J320 Chronic maxillary sinusitis: Secondary | ICD-10-CM

## 2021-07-03 ENCOUNTER — Other Ambulatory Visit: Payer: Self-pay | Admitting: Family Medicine

## 2021-07-03 DIAGNOSIS — R140 Abdominal distension (gaseous): Secondary | ICD-10-CM

## 2021-07-03 DIAGNOSIS — R110 Nausea: Secondary | ICD-10-CM

## 2021-07-12 ENCOUNTER — Other Ambulatory Visit: Payer: Self-pay

## 2021-07-12 ENCOUNTER — Ambulatory Visit (INDEPENDENT_AMBULATORY_CARE_PROVIDER_SITE_OTHER): Payer: Managed Care, Other (non HMO) | Admitting: Family Medicine

## 2021-07-12 VITALS — BP 132/70 | HR 96 | Temp 98.9°F | Resp 14 | Ht 67.0 in | Wt 202.0 lb

## 2021-07-12 DIAGNOSIS — R0789 Other chest pain: Secondary | ICD-10-CM

## 2021-07-12 NOTE — Progress Notes (Signed)
Subjective:    Patient ID: Dustin Solis, male    DOB: Dec 14, 1970, 50 y.o.   MRN: [id number]  HPI Patient is under a lot of stress recently.  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  He is concerned about some symptoms he is having.  Recently he states that he has been a little more dizzy than normal.  His blood pressure today is excellent however.  He question if this could be due to his allergies however he is also had episodic nausea.  Last night he was lying in bed and had a tingling sensation in his left chest just above his left nipple.  It was a sharp prickly pain that lasted few seconds and then went away and then came back.  He denies any chest pain with activity.  He denies any substernal pressure.  He denies any dyspnea on exertion.  EKG today shows normal sinus rhythm with no evidence of ischemia or infarction.  There is possible left atrial enlargement but otherwise the EKG is normal.  He denies any orthopnea or paroxysmal nocturnal dyspnea.  He denies any sinus pain.  He denies any vomiting or diarrhea. Past Medical History:  Diagnosis Date   Anxiety    Chronic kidney disease    Depression    Past Surgical History:  Procedure Laterality Date   NASAL SEPTUM SURGERY  1999   Current Outpatient Medications on File Prior to Visit  Medication Sig Dispense Refill   acetaminophen (TYLENOL) 500 MG tablet Take 2 tablets (1,000 mg total) by mouth every 6 (six) hours as needed. 30 tablet 0   amLODipine (NORVASC) 10 MG tablet TAKE 1 TABLET BY MOUTH EVERY DAY 270 tablet 1   Azelastine HCl 137 MCG/SPRAY SOLN PLACE 1 SPRAY INTO BOTH NOSTRILS 2 (TWO) TIMES DAILY. USE IN EACH NOSTRIL AS DIRECTED 30 mL 1   cetirizine (ZYRTEC) 10 MG tablet Take 10 mg by mouth daily.     EPIPEN 2-PAK 0.3 MG/0.3ML SOAJ injection Inject 0.3 mg into the muscle once.      fluticasone (FLONASE) 50 MCG/ACT nasal spray PLACE 2 SPRAYS INTO BOTH NOSTRILS DAILY. PLACE 2 SPRAYS INTO BOTH NOSTRILS DAILY. 48  mL 2   lamoTRIgine (LAMICTAL) 150 MG tablet Take 300 mg by mouth daily.      montelukast (SINGULAIR) 10 MG tablet Take 10 mg by mouth at bedtime.      Multiple Vitamin (MULTIVITAMIN) tablet Take 1 tablet by mouth daily.     omeprazole (PRILOSEC) 20 MG capsule TAKE 1 CAPSULE BY MOUTH EVERY DAY 30 capsule 11   paliperidone (INVEGA) 6 MG 24 hr tablet Take 6 mg by mouth at bedtime.     QUEtiapine (SEROQUEL) 25 MG tablet Take 12.5-25 mg by mouth at bedtime.     No current facility-administered medications on file prior to visit.   No Known Allergies Social History   Socioeconomic History   Marital status: Married    Spouse name: Not on file   Number of children: Not on file   Years of education: Not on file   Highest education level: Not on file  Occupational History   Not on file  Tobacco Use   Smoking status: Never   Smokeless tobacco: Never  Substance and Sexual Activity   Alcohol use: No   Drug use: No   Sexual activity: Not on file  Other Topics Concern   Not on file  Social History Narrative   Not on file  Social Determinants of Health   Financial Resource Strain: Not on file  Food Insecurity: Not on file  Transportation Needs: Not on file  Physical Activity: Not on file  Stress: Not on file  Social Connections: Not on file  Intimate Partner Violence: Not on file   Family History  Problem Relation Age of Onset   Cancer Mother        lung   Cancer Father        prostate cancer   Hypertension Father    Hyperlipidemia Father    Hypertension Brother    Hyperlipidemia Brother        Review of Systems  All other systems reviewed and are negative.     Objective:   Physical Exam Constitutional:      General: He is not in acute distress.    Appearance: Normal appearance. He is not ill-appearing, toxic-appearing or diaphoretic.  HENT:     Head: Normocephalic and atraumatic.     Right Ear: Tympanic membrane, ear canal and external ear normal. There is no  impacted cerumen.     Left Ear: Tympanic membrane, ear canal and external ear normal. There is no impacted cerumen.     Nose: Nose normal. No congestion or rhinorrhea.     Mouth/Throat:     Mouth: Mucous membranes are moist.     Pharynx: No oropharyngeal exudate or posterior oropharyngeal erythema.  Eyes:     General: No scleral icterus.       Right eye: No discharge.        Left eye: No discharge.     Extraocular Movements: Extraocular movements intact.     Conjunctiva/sclera: Conjunctivae normal.     Pupils: Pupils are equal, round, and reactive to light.  Neck:     Vascular: No carotid bruit.  Cardiovascular:     Rate and Rhythm: Normal rate and regular rhythm.     Pulses: Normal pulses.     Heart sounds: Normal heart sounds. No murmur heard.   No friction rub. No gallop.  Pulmonary:     Effort: Pulmonary effort is normal. No respiratory distress.     Breath sounds: Normal breath sounds. No stridor. No wheezing, rhonchi or rales.  Chest:     Chest wall: No tenderness.  Abdominal:     General: Abdomen is flat. Bowel sounds are normal. There is no distension.     Palpations: Abdomen is soft. There is no mass.     Tenderness: There is no abdominal tenderness. There is no right CVA tenderness, left CVA tenderness, guarding or rebound.     Hernia: No hernia is present.  Musculoskeletal:        General: No swelling, tenderness or deformity.     Cervical back: Normal range of motion and neck supple. No rigidity. No muscular tenderness.     Right lower leg: No edema.     Left lower leg: No edema.  Lymphadenopathy:     Cervical: No cervical adenopathy.  Skin:    General: Skin is warm.     Coloration: Skin is not jaundiced or pale.     Findings: No bruising, erythema, lesion or rash.  Neurological:     General: No focal deficit present.     Mental Status: He is alert and oriented to person, place, and time. Mental status is at baseline.     Cranial Nerves: No cranial nerve  deficit.     Sensory: No sensory deficit.     Motor:  No weakness.     Coordination: Coordination normal.     Gait: Gait normal.     Deep Tendon Reflexes: Reflexes normal.  Psychiatric:        Mood and Affect: Mood normal.        Behavior: Behavior normal.        Thought Content: Thought content normal.        Judgment: Judgment normal.          Assessment & Plan:  Atypical chest pain - Plan: EKG 12-Lead Patient's exam today is normal.  Given the multitude of symptoms including dizziness, nausea, atypical chest pain, he also reports feeling tired and sleeping all the time, I feel that the patient is most likely dealing with stress and anxiety.  I reassured the patient I do not see any abnormalities on his exam.  Symptoms have been occurring gradually and haphazardly over the last few weeks.  If symptoms change or worsen he is to contact me immediately

## 2021-07-24 ENCOUNTER — Other Ambulatory Visit: Payer: Self-pay

## 2021-07-24 ENCOUNTER — Other Ambulatory Visit: Payer: Managed Care, Other (non HMO)

## 2021-07-24 DIAGNOSIS — N183 Chronic kidney disease, stage 3 unspecified: Secondary | ICD-10-CM

## 2021-07-24 DIAGNOSIS — Z13220 Encounter for screening for lipoid disorders: Secondary | ICD-10-CM

## 2021-07-24 DIAGNOSIS — Z136 Encounter for screening for cardiovascular disorders: Secondary | ICD-10-CM

## 2021-07-24 DIAGNOSIS — Z125 Encounter for screening for malignant neoplasm of prostate: Secondary | ICD-10-CM

## 2021-07-25 LAB — LIPID PANEL
Cholesterol: 149 mg/dL (ref <200)
HDL: 47 mg/dL (ref >=40)
LDL Cholesterol (Calc): 75 mg/dL (calc)
Non-HDL Cholesterol (Calc): 102 mg/dL (calc) (ref <130)
Total CHOL/HDL Ratio: 3.2 (calc) (ref <5.0)
Triglycerides: 171 mg/dL — ABNORMAL HIGH (ref <150)

## 2021-07-25 LAB — CBC WITH DIFFERENTIAL/PLATELET
Absolute Monocytes: 402 cells/uL (ref 200–950)
Basophils Absolute: 20 cells/uL (ref 0–200)
Basophils Relative: 0.4 %
Eosinophils Absolute: 69 cells/uL (ref 15–500)
Eosinophils Relative: 1.4 %
HCT: 44.8 % (ref 38.5–50.0)
Hemoglobin: 15.1 g/dL (ref 13.2–17.1)
Lymphs Abs: 2117 cells/uL (ref 850–3900)
MCH: 29.4 pg (ref 27.0–33.0)
MCHC: 33.7 g/dL (ref 32.0–36.0)
MCV: 87.2 fL (ref 80.0–100.0)
MPV: 9.3 fL (ref 7.5–12.5)
Monocytes Relative: 8.2 %
Neutro Abs: 2293 cells/uL (ref 1500–7800)
Neutrophils Relative %: 46.8 %
Platelets: 405 10*3/uL — ABNORMAL HIGH (ref 140–400)
RBC: 5.14 10*6/uL (ref 4.20–5.80)
RDW: 11.8 % (ref 11.0–15.0)
Total Lymphocyte: 43.2 %
WBC: 4.9 10*3/uL (ref 3.8–10.8)

## 2021-07-25 LAB — COMPLETE METABOLIC PANEL WITH GFR
AG Ratio: 1.5 (calc) (ref 1.0–2.5)
ALT: 36 U/L (ref 9–46)
AST: 32 U/L (ref 10–35)
Albumin: 4.3 g/dL (ref 3.6–5.1)
Alkaline phosphatase (APISO): 106 U/L (ref 35–144)
BUN/Creatinine Ratio: 9 (calc) (ref 6–22)
BUN: 14 mg/dL (ref 7–25)
CO2: 27 mmol/L (ref 20–32)
Calcium: 10.5 mg/dL — ABNORMAL HIGH (ref 8.6–10.3)
Chloride: 105 mmol/L (ref 98–110)
Creat: 1.6 mg/dL — ABNORMAL HIGH (ref 0.70–1.30)
Globulin: 2.9 g/dL (calc) (ref 1.9–3.7)
Glucose, Bld: 96 mg/dL (ref 65–99)
Potassium: 4.6 mmol/L (ref 3.5–5.3)
Sodium: 141 mmol/L (ref 135–146)
Total Bilirubin: 0.5 mg/dL (ref 0.2–1.2)
Total Protein: 7.2 g/dL (ref 6.1–8.1)
eGFR: 52 mL/min/{1.73_m2} — ABNORMAL LOW (ref >=60)

## 2021-07-25 LAB — PSA: PSA: 0.25 ng/mL (ref <=4.00)

## 2021-11-23 ENCOUNTER — Other Ambulatory Visit: Payer: Self-pay | Admitting: Family Medicine

## 2021-11-26 ENCOUNTER — Other Ambulatory Visit: Payer: Self-pay

## 2021-12-11 ENCOUNTER — Ambulatory Visit: Payer: Managed Care, Other (non HMO) | Admitting: Family Medicine

## 2022-03-29 ENCOUNTER — Other Ambulatory Visit: Payer: Self-pay | Admitting: Family Medicine

## 2022-03-29 DIAGNOSIS — J320 Chronic maxillary sinusitis: Secondary | ICD-10-CM

## 2022-03-29 NOTE — Telephone Encounter (Signed)
Requested Prescriptions  Pending Prescriptions Disp Refills  . fluticasone (FLONASE) 50 MCG/ACT nasal spray [Pharmacy Med Name: FLUTICASONE PROP 50 MCG SPRAY] 48 mL 1    Sig: PLACE 2 SPRAYS INTO BOTH NOSTRILS DAILY. PLACE 2 SPRAYS INTO BOTH NOSTRILS DAILY.     Ear, Nose, and Throat: Nasal Preparations - Corticosteroids Passed - 03/29/2022 11:31 AM      Passed - Valid encounter within last 12 months    Recent Outpatient Visits          8 months ago Atypical chest pain   [place] Susy Frizzle, MD   1 year ago Stage 3 chronic kidney disease, unspecified whether stage 3a or 3b CKD ([place])   [place] Pickard, Cammie Mcgee, MD   1 year ago Left wrist pain   [place], [place], FNP   2 years ago Stage 3 chronic kidney disease, unspecified whether stage 3a or 3b CKD   [place] Pickard, Cammie Mcgee, MD   2 years ago General medical exam   [place] Pickard, Cammie Mcgee, MD

## 2022-04-13 ENCOUNTER — Other Ambulatory Visit: Payer: Self-pay | Admitting: Family Medicine

## 2022-04-15 NOTE — Telephone Encounter (Signed)
Requested Prescriptions  Pending Prescriptions Disp Refills  . Azelastine HCl 137 MCG/SPRAY SOLN [Pharmacy Med Name: AZELASTINE 0.1% (137 MCG) SPRY] 30 mL 1    Sig: PLACE 1 SPRAY INTO BOTH NOSTRILS 2 (TWO) TIMES DAILY. USE IN EACH NOSTRIL AS DIRECTED     Ear, Nose, and Throat: Nasal Preparations - Antiallergy Passed - 04/15/2022  8:59 AM      Passed - Valid encounter within last 12 months    Recent Outpatient Visits          9 months ago Atypical chest pain   [place] Susy Frizzle, MD   1 year ago Stage 3 chronic kidney disease, unspecified whether stage 3a or 3b CKD ([place])   [place] Pickard, Cammie Mcgee, MD   1 year ago Left wrist pain   [place], [place], FNP   2 years ago Stage 3 chronic kidney disease, unspecified whether stage 3a or 3b CKD   [place] Pickard, Cammie Mcgee, MD   2 years ago General medical exam   [place] Pickard, Cammie Mcgee, MD

## 2022-04-16 ENCOUNTER — Other Ambulatory Visit: Payer: Self-pay | Admitting: Family Medicine

## 2022-04-17 NOTE — Telephone Encounter (Signed)
Requested Prescriptions  Pending Prescriptions Disp Refills  . Azelastine HCl 137 MCG/SPRAY SOLN [Pharmacy Med Name: AZELASTINE 0.1% (137 MCG) SPRY]  1    Sig: PLACE 1 SPRAY INTO BOTH NOSTRILS 2 (TWO) TIMES DAILY. USE IN EACH NOSTRIL AS DIRECTED     Ear, Nose, and Throat: Nasal Preparations - Antiallergy Passed - 04/16/2022  7:59 PM      Passed - Valid encounter within last 12 months    Recent Outpatient Visits          9 months ago Atypical chest pain   [place] Susy Frizzle, MD   1 year ago Stage 3 chronic kidney disease, unspecified whether stage 3a or 3b CKD ([place])   [place] Pickard, Cammie Mcgee, MD   1 year ago Left wrist pain   [place], [place], FNP   2 years ago Stage 3 chronic kidney disease, unspecified whether stage 3a or 3b CKD   [place] Pickard, Cammie Mcgee, MD   2 years ago General medical exam   [place] Pickard, Cammie Mcgee, MD

## 2022-07-06 ENCOUNTER — Other Ambulatory Visit: Payer: Self-pay | Admitting: Family Medicine

## 2022-07-06 DIAGNOSIS — R140 Abdominal distension (gaseous): Secondary | ICD-10-CM

## 2022-07-06 DIAGNOSIS — R110 Nausea: Secondary | ICD-10-CM

## 2022-07-08 NOTE — Telephone Encounter (Signed)
Pls call pt for physical w/pcp for med eval/refills

## 2022-07-09 ENCOUNTER — Telehealth: Payer: Self-pay | Admitting: Family Medicine

## 2022-07-09 NOTE — Telephone Encounter (Signed)
Left message to return call. Need to schedule cpe for med eval and refills.    LOV 07/12/2021, no CPE in appt history.

## 2022-08-02 ENCOUNTER — Other Ambulatory Visit: Payer: Self-pay | Admitting: Family Medicine

## 2022-08-02 DIAGNOSIS — R110 Nausea: Secondary | ICD-10-CM

## 2022-08-02 DIAGNOSIS — R140 Abdominal distension (gaseous): Secondary | ICD-10-CM

## 2022-08-02 NOTE — Telephone Encounter (Signed)
Requested medications are due for refill today.  yes  Requested medications are on the active medications list.  yes  Last refill. 07/08/2022 330 0 rf  Future visit scheduled.   no  Notes to clinic.  Pt already given a courtesy refill.    Requested Prescriptions  Pending Prescriptions Disp Refills   omeprazole (PRILOSEC) 20 MG capsule [Pharmacy Med Name: OMEPRAZOLE DR 20 MG CAPSULE] 90 capsule 1    Sig: TAKE 1 CAPSULE BY MOUTH EVERY DAY     Gastroenterology: Proton Pump Inhibitors Failed - 08/02/2022 11:33 AM      Failed - Valid encounter within last 12 months    Recent Outpatient Visits           1 year ago Atypical chest pain   [place] Susy Frizzle, MD   1 year ago Stage 3 chronic kidney disease, unspecified whether stage 3a or 3b CKD ([place])   [place] Susy Frizzle, MD   2 years ago Left wrist pain   [place], [place], FNP   2 years ago Stage 3 chronic kidney disease, unspecified whether stage 3a or 3b CKD   [place] Pickard, Cammie Mcgee, MD   3 years ago General medical exam   [place] Pickard, Cammie Mcgee, MD

## 2022-10-24 ENCOUNTER — Other Ambulatory Visit: Payer: Self-pay | Admitting: Family Medicine

## 2022-10-24 DIAGNOSIS — J320 Chronic maxillary sinusitis: Secondary | ICD-10-CM

## 2022-11-07 ENCOUNTER — Ambulatory Visit (INDEPENDENT_AMBULATORY_CARE_PROVIDER_SITE_OTHER): Payer: Managed Care, Other (non HMO) | Admitting: Family Medicine

## 2022-11-07 ENCOUNTER — Encounter: Payer: Self-pay | Admitting: Family Medicine

## 2022-11-07 ENCOUNTER — Other Ambulatory Visit: Payer: Self-pay | Admitting: Family Medicine

## 2022-11-07 VITALS — BP 120/78 | HR 65 | Ht 67.0 in | Wt 204.0 lb

## 2022-11-07 DIAGNOSIS — Z125 Encounter for screening for malignant neoplasm of prostate: Secondary | ICD-10-CM | POA: Diagnosis not present

## 2022-11-07 DIAGNOSIS — Z0001 Encounter for general adult medical examination with abnormal findings: Secondary | ICD-10-CM

## 2022-11-07 DIAGNOSIS — Z1211 Encounter for screening for malignant neoplasm of colon: Secondary | ICD-10-CM | POA: Diagnosis not present

## 2022-11-07 DIAGNOSIS — Z0000 Encounter for general adult medical examination without abnormal findings: Secondary | ICD-10-CM

## 2022-11-07 DIAGNOSIS — N183 Chronic kidney disease, stage 3 unspecified: Secondary | ICD-10-CM

## 2022-11-07 DIAGNOSIS — R5383 Other fatigue: Secondary | ICD-10-CM | POA: Diagnosis not present

## 2022-11-07 NOTE — Progress Notes (Unsigned)
   Subjective:    Patient ID: Dustin Solis, male    DOB: 07-02-1971, 52 y.o.   MRN: [id number]  HPI    Review of Systems     Objective:   Physical Exam        Assessment & Plan:

## 2022-11-07 NOTE — Progress Notes (Signed)
Subjective:    Patient ID: Dustin Solis, male    DOB: [birth year], 52 y.o.   MRN: [id number]  HPI Patient is a very pleasant 52 year old African-American male here today for complete physical exam.  Patient states that he has been tired recently.  He goes to bed at midnight.  He will often awaken at 9 AM but he will not get out of bed until 11.  He states that he has no energy.  He is currently on disability stemming from depression.  However he does not feel that the depression is causing the fatigue.  He reports that he feels like he has very little energy and stamina.  He denies any chest pain or shortness of breath or dyspnea on exertion.  His last colon cancer screening with a Cologuard that was negative in 2020.  This was due to.  He is prostate cancer screening.  Past Medical History:  Diagnosis Date   Anxiety    Chronic kidney disease    Depression    Past Surgical History:  Procedure Laterality Date   NASAL SEPTUM SURGERY  1999   Current Outpatient Medications on File Prior to Visit  Medication Sig Dispense Refill   acetaminophen (TYLENOL) 500 MG tablet Take 2 tablets (1,000 mg total) by mouth every 6 (six) hours as needed. 30 tablet 0   amLODipine (NORVASC) 10 MG tablet TAKE 1 TABLET BY MOUTH EVERY DAY 90 tablet 5   Azelastine HCl 137 MCG/SPRAY SOLN PLACE 1 SPRAY INTO BOTH NOSTRILS 2 (TWO) TIMES DAILY. USE IN EACH NOSTRIL AS DIRECTED 90 mL 0   cetirizine (ZYRTEC) 10 MG tablet Take 10 mg by mouth daily.     EPIPEN 2-PAK 0.3 MG/0.3ML SOAJ injection Inject 0.3 mg into the muscle once.      fluticasone (FLONASE) 50 MCG/ACT nasal spray PLACE 2 SPRAYS INTO BOTH NOSTRILS DAILY. PLACE 2 SPRAYS INTO BOTH NOSTRILS DAILY. 16 mL 5   lamoTRIgine (LAMICTAL) 150 MG tablet Take 300 mg by mouth daily.      montelukast (SINGULAIR) 10 MG tablet Take 10 mg by mouth at bedtime.      Multiple Vitamin (MULTIVITAMIN) tablet Take 1 tablet by mouth daily.     omeprazole (PRILOSEC) 20 MG capsule TAKE  1 CAPSULE BY MOUTH EVERY DAY 90 capsule 3   ondansetron (ZOFRAN) 4 MG tablet Take 4 mg by mouth 2 (two) times daily as needed.     paliperidone (INVEGA) 6 MG 24 hr tablet Take 6 mg by mouth at bedtime.     QUEtiapine (SEROQUEL) 25 MG tablet Take 12.5-25 mg by mouth at bedtime.     No current facility-administered medications on file prior to visit.   No Known Allergies Social History   Socioeconomic History   Marital status: Married    Spouse name: Not on file   Number of children: Not on file   Years of education: Not on file   Highest education level: Not on file  Occupational History   Not on file  Tobacco Use   Smoking status: Never   Smokeless tobacco: Never  Substance and Sexual Activity   Alcohol use: No   Drug use: No   Sexual activity: Not on file  Other Topics Concern   Not on file  Social History Narrative   Not on file   Social Determinants of Health   Financial Resource Strain: Not on file  Food Insecurity: Not on file  Transportation Needs: Not on file  Physical Activity:  Not on file  Stress: Not on file  Social Connections: Not on file  Intimate Partner Violence: Not on file   Family History  Problem Relation Age of Onset   Cancer Mother        lung   Cancer Father        prostate cancer   Hypertension Father    Hyperlipidemia Father    Hypertension Brother    Hyperlipidemia Brother        Review of Systems  All other systems reviewed and are negative.      Objective:   Physical Exam Constitutional:      General: He is not in acute distress.    Appearance: Normal appearance. He is not ill-appearing, toxic-appearing or diaphoretic.  HENT:     Head: Normocephalic and atraumatic.     Right Ear: Tympanic membrane, ear canal and external ear normal. There is no impacted cerumen.     Left Ear: Tympanic membrane, ear canal and external ear normal. There is no impacted cerumen.     Nose: Nose normal. No congestion or rhinorrhea.      Mouth/Throat:     Mouth: Mucous membranes are moist.     Pharynx: No oropharyngeal exudate or posterior oropharyngeal erythema.  Eyes:     General: No scleral icterus.       Right eye: No discharge.        Left eye: No discharge.     Extraocular Movements: Extraocular movements intact.     Conjunctiva/sclera: Conjunctivae normal.     Pupils: Pupils are equal, round, and reactive to light.  Neck:     Vascular: No carotid bruit.  Cardiovascular:     Rate and Rhythm: Normal rate and regular rhythm.     Pulses: Normal pulses.     Heart sounds: Normal heart sounds. No murmur heard.    No friction rub. No gallop.  Pulmonary:     Effort: Pulmonary effort is normal. No respiratory distress.     Breath sounds: Normal breath sounds. No stridor. No wheezing, rhonchi or rales.  Chest:     Chest wall: No tenderness.  Abdominal:     General: Abdomen is flat. Bowel sounds are normal. There is no distension.     Palpations: Abdomen is soft. There is no mass.     Tenderness: There is no abdominal tenderness. There is no right CVA tenderness, left CVA tenderness, guarding or rebound.     Hernia: No hernia is present.  Musculoskeletal:        General: No swelling, tenderness or deformity.     Cervical back: Normal range of motion and neck supple. No rigidity. No muscular tenderness.     Right lower leg: No edema.     Left lower leg: No edema.  Lymphadenopathy:     Cervical: No cervical adenopathy.  Skin:    General: Skin is warm.     Coloration: Skin is not jaundiced or pale.     Findings: No bruising, erythema, lesion or rash.  Neurological:     General: No focal deficit present.     Mental Status: He is alert and oriented to person, place, and time. Mental status is at baseline.     Cranial Nerves: No cranial nerve deficit.     Sensory: No sensory deficit.     Motor: No weakness.     Coordination: Coordination normal.     Gait: Gait normal.     Deep Tendon Reflexes: Reflexes normal.  Psychiatric:        Mood and Affect: Mood normal.        Behavior: Behavior normal.        Thought Content: Thought content normal.        Judgment: Judgment normal.    Patient has some mild gynecomastia and a small umbilical hernia       Assessment & Plan:  Fatigue, unspecified type - Plan: CBC with Differential/Platelet, Vitamin B12, COMPLETE METABOLIC PANEL WITH GFR, Lipid panel, TSH, Testosterone Total,Free,Bio, Males  Prostate cancer screening - Plan: Ambulatory referral to Gastroenterology, PSA  Colon cancer screening  General medical exam  Stage 3 chronic kidney disease, unspecified whether stage 3a or 3b CKD ([place]) Patient's blood pressure today is excellent.  I will check a B12 level to look for B12 deficiency as a cause of his fatigue.  I will check a TSH to look for hypothyroidism.  Given his gynecomastia and the fact that he is on Invega, I will check a testosterone level to see if hypogonadism could be contributing to his fatigue.  With regards to his physical exam I will check a lipid panel as well.  Screen for prostate cancer with a PSA.  Screen for colon cancer with a colonoscopy.  If lab work is normal, consider referral for sleep study

## 2022-11-08 ENCOUNTER — Other Ambulatory Visit: Payer: Self-pay

## 2022-11-08 DIAGNOSIS — R5383 Other fatigue: Secondary | ICD-10-CM

## 2022-11-08 LAB — CBC WITH DIFFERENTIAL/PLATELET
Absolute Monocytes: 352 cells/uL (ref 200–950)
Basophils Absolute: 22 cells/uL (ref 0–200)
Basophils Relative: 0.5 %
Eosinophils Absolute: 40 cells/uL (ref 15–500)
Eosinophils Relative: 0.9 %
HCT: 43.6 % (ref 38.5–50.0)
Hemoglobin: 15 g/dL (ref 13.2–17.1)
Lymphs Abs: 1316 cells/uL (ref 850–3900)
MCH: 29.1 pg (ref 27.0–33.0)
MCHC: 34.4 g/dL (ref 32.0–36.0)
MCV: 84.5 fL (ref 80.0–100.0)
MPV: 9.4 fL (ref 7.5–12.5)
Monocytes Relative: 8 %
Neutro Abs: 2671 cells/uL (ref 1500–7800)
Neutrophils Relative %: 60.7 %
Platelets: 372 10*3/uL (ref 140–400)
RBC: 5.16 10*6/uL (ref 4.20–5.80)
RDW: 12 % (ref 11.0–15.0)
Total Lymphocyte: 29.9 %
WBC: 4.4 10*3/uL (ref 3.8–10.8)

## 2022-11-08 LAB — COMPLETE METABOLIC PANEL WITH GFR
AG Ratio: 1.5 (calc) (ref 1.0–2.5)
ALT: 36 U/L (ref 9–46)
AST: 30 U/L (ref 10–35)
Albumin: 4.4 g/dL (ref 3.6–5.1)
Alkaline phosphatase (APISO): 118 U/L (ref 35–144)
BUN/Creatinine Ratio: 11 (calc) (ref 6–22)
BUN: 15 mg/dL (ref 7–25)
CO2: 26 mmol/L (ref 20–32)
Calcium: 10.3 mg/dL (ref 8.6–10.3)
Chloride: 104 mmol/L (ref 98–110)
Creat: 1.38 mg/dL — ABNORMAL HIGH (ref 0.70–1.30)
Globulin: 3 g/dL (calc) (ref 1.9–3.7)
Glucose, Bld: 101 mg/dL — ABNORMAL HIGH (ref 65–99)
Potassium: 4.4 mmol/L (ref 3.5–5.3)
Sodium: 140 mmol/L (ref 135–146)
Total Bilirubin: 0.4 mg/dL (ref 0.2–1.2)
Total Protein: 7.4 g/dL (ref 6.1–8.1)
eGFR: 61 mL/min/{1.73_m2} (ref >=60)

## 2022-11-08 LAB — LIPID PANEL
Cholesterol: 153 mg/dL (ref <200)
HDL: 50 mg/dL (ref >=40)
LDL Cholesterol (Calc): 80 mg/dL (calc)
Non-HDL Cholesterol (Calc): 103 mg/dL (calc) (ref <130)
Total CHOL/HDL Ratio: 3.1 (calc) (ref <5.0)
Triglycerides: 129 mg/dL (ref <150)

## 2022-11-08 LAB — TESTOSTERONE TOTAL,FREE,BIO, MALES
Albumin: 4.4 g/dL (ref 3.6–5.1)
Sex Hormone Binding: 23 nmol/L (ref 10–50)
Testosterone, Bioavailable: 94.8 ng/dL — ABNORMAL LOW (ref 110.0–575.0)
Testosterone, Free: 47.1 pg/mL (ref 46.0–224.0)
Testosterone: 281 ng/dL (ref 250–827)

## 2022-11-08 LAB — TSH: TSH: 2.79 mIU/L (ref 0.40–4.50)

## 2022-11-08 LAB — PSA: PSA: 0.31 ng/mL (ref <=4.00)

## 2022-11-08 LAB — VITAMIN B12: Vitamin B-12: 647 pg/mL (ref 200–1100)

## 2022-11-13 ENCOUNTER — Other Ambulatory Visit: Payer: Self-pay

## 2022-11-13 ENCOUNTER — Telehealth: Payer: Self-pay | Admitting: Family Medicine

## 2022-11-13 DIAGNOSIS — R110 Nausea: Secondary | ICD-10-CM

## 2022-11-13 DIAGNOSIS — R140 Abdominal distension (gaseous): Secondary | ICD-10-CM

## 2022-11-13 MED ORDER — OMEPRAZOLE 20 MG PO CPDR: DELAYED_RELEASE_CAPSULE | ORAL | 1 refills | Status: DC

## 2022-11-13 NOTE — Telephone Encounter (Signed)
Prescription Request  11/13/2022  Is this a "Controlled Substance" medicine?  No   LOV: 11/07/2022  What is the name of the medication or equipment?   omeprazole (PRILOSEC) 20 MG capsule   Have you contacted your pharmacy to request a refill? Yes   Which pharmacy would you like this sent to?  [id number] [place] [address] [place] [id number]: [phone]: [phone]   Patient notified that their request is being sent to the clinical staff for review and that they should receive a response within 2 business days.   Please advise pharmacist.

## 2022-11-27 ENCOUNTER — Other Ambulatory Visit: Payer: Self-pay

## 2022-11-27 ENCOUNTER — Ambulatory Visit: Payer: Managed Care, Other (non HMO)

## 2022-11-27 VITALS — Ht 67.0 in | Wt 205.0 lb

## 2022-11-27 DIAGNOSIS — Z1211 Encounter for screening for malignant neoplasm of colon: Secondary | ICD-10-CM

## 2022-11-27 MED ORDER — NA SULFATE-K SULFATE-MG SULF 17.5-3.13-1.6 GM/177ML PO SOLN: 1.0000 | Freq: Once | ORAL | 0 refills | Status: AC

## 2022-11-27 NOTE — Progress Notes (Signed)
Denies allergies to eggs or soy products. Denies complication of anesthesia or sedation. Denies use of weight loss medication. Denies use of O2.   Emmi instructions given for colonoscopy. 

## 2022-12-10 ENCOUNTER — Other Ambulatory Visit: Payer: Self-pay

## 2022-12-10 ENCOUNTER — Other Ambulatory Visit: Payer: Self-pay | Admitting: Family Medicine

## 2022-12-10 DIAGNOSIS — R140 Abdominal distension (gaseous): Secondary | ICD-10-CM

## 2022-12-10 DIAGNOSIS — R110 Nausea: Secondary | ICD-10-CM

## 2022-12-10 MED ORDER — OMEPRAZOLE 20 MG PO CPDR: DELAYED_RELEASE_CAPSULE | ORAL | 1 refills | Status: DC

## 2022-12-10 NOTE — Telephone Encounter (Signed)
Requested medication (s) are due for refill today: expired medication  Requested medication (s) are on the active medication list: yes   Last refill:  11/26/21 #90 5 refills  Future visit scheduled: no   Notes to clinic:  last OV 11/07/22. Expired medication. Do you want to renew Rx?     Requested Prescriptions  Pending Prescriptions Disp Refills   amLODipine (NORVASC) 10 MG tablet [Pharmacy Med Name: AMLODIPINE BESYLATE 10 MG TAB] 30 tablet 17    Sig: TAKE 1 TABLET BY MOUTH EVERY DAY     Cardiovascular: Calcium Channel Blockers 2 Failed - 12/10/2022  4:23 PM      Failed - Valid encounter within last 6 months    Recent Outpatient Visits           1 year ago Atypical chest pain   [place] Susy Frizzle, MD   1 year ago Stage 3 chronic kidney disease, unspecified whether stage 3a or 3b CKD ([place])   [place] Pickard, Cammie Mcgee, MD   2 years ago Left wrist pain   [place], [place], FNP   2 years ago Stage 3 chronic kidney disease, unspecified whether stage 3a or 3b CKD   [place] Pickard, Cammie Mcgee, MD   3 years ago General medical exam   [place] Susy Frizzle, MD              Passed - Last BP in normal range    BP Readings from Last 1 Encounters:  11/07/22 120/78         Passed - Last Heart Rate in normal range    Pulse Readings from Last 1 Encounters:  11/07/22 65

## 2022-12-12 ENCOUNTER — Ambulatory Visit: Payer: Managed Care, Other (non HMO) | Admitting: Gastroenterology

## 2022-12-12 ENCOUNTER — Encounter: Payer: Self-pay | Admitting: Gastroenterology

## 2022-12-12 VITALS — BP 131/78 | HR 80 | Temp 98.2°F | Resp 16 | Ht 67.0 in | Wt 205.0 lb

## 2022-12-12 DIAGNOSIS — Z1211 Encounter for screening for malignant neoplasm of colon: Secondary | ICD-10-CM

## 2022-12-12 DIAGNOSIS — K639 Disease of intestine, unspecified: Secondary | ICD-10-CM

## 2022-12-12 DIAGNOSIS — D122 Benign neoplasm of ascending colon: Secondary | ICD-10-CM | POA: Diagnosis not present

## 2022-12-12 MED ORDER — SODIUM CHLORIDE 0.9 % IV SOLN: 500.0000 mL | Freq: Once | INTRAVENOUS | Status: DC

## 2022-12-12 NOTE — Patient Instructions (Signed)
-  Handout on polyps and diverticulosis provided -await pathology results -repeat colonoscopy for surveillance recommended. Date to be determined when pathology result become available   -Continue present medications  -Return to office at appointment to be scheduled to discuss pathology results and possible treatment. Please call to schedule appointment  YOU HAD AN ENDOSCOPIC PROCEDURE TODAY AT [facility]:   Refer to the procedure report that was given to you for any specific questions about what was found during the examination.  If the procedure report does not answer your questions, please call your gastroenterologist to clarify.  If you requested that your care partner not be given the details of your procedure findings, then the procedure report has been included in a sealed envelope for you to review at your convenience later.  YOU SHOULD EXPECT: Some feelings of bloating in the abdomen. Passage of more gas than usual.  Walking can help get rid of the air that was put into your GI tract during the procedure and reduce the bloating. If you had a lower endoscopy (such as a colonoscopy or flexible sigmoidoscopy) you may notice spotting of blood in your stool or on the toilet paper. If you underwent a bowel prep for your procedure, you may not have a normal bowel movement for a few days.  Please Note:  You might notice some irritation and congestion in your nose or some drainage.  This is from the oxygen used during your procedure.  There is no need for concern and it should clear up in a day or so.  SYMPTOMS TO REPORT IMMEDIATELY:  Following lower endoscopy (colonoscopy or flexible sigmoidoscopy):  Excessive amounts of blood in the stool  Significant tenderness or worsening of abdominal pains  Swelling of the abdomen that is new, acute  Fever of 100F or higher  For urgent or emergent issues, a gastroenterologist can be reached at any hour by calling [phone]. Do not use  MyChart messaging for urgent concerns.    DIET:  We do recommend a small meal at first, but then you may proceed to your regular diet.  Drink plenty of fluids but you should avoid alcoholic beverages for 24 hours.  ACTIVITY:  You should plan to take it easy for the rest of today and you should NOT DRIVE or use heavy machinery until tomorrow (because of the sedation medicines used during the test).    FOLLOW UP: Our staff will call the number listed on your records the next business day following your procedure.  We will call around 7:15- 8:00 am to check on you and address any questions or concerns that you may have regarding the information given to you following your procedure. If we do not reach you, we will leave a message.     If any biopsies were taken you will be contacted by phone or by letter within the next 1-3 weeks.  Please call us at [phone] if you have not heard about the biopsies in 3 weeks.    SIGNATURES/CONFIDENTIALITY: You and/or your care partner have signed paperwork which will be entered into your electronic medical record.  These signatures attest to the fact that that the information above on your After Visit Summary has been reviewed and is understood.  Full responsibility of the confidentiality of this discharge information lies with you and/or your care-partner.

## 2022-12-12 NOTE — Progress Notes (Signed)
History and Physical:  This patient presents for endoscopic testing for: Encounter Diagnosis  Name Primary?   Special screening for malignant neoplasms, colon Yes    Average risk for CRC - first screening colonoscopy. Negative cologuard in 2020 Patient denies chronic abdominal pain, rectal bleeding, constipation or diarrhea.   Patient is otherwise without complaints or active issues today.   Past Medical History: Past Medical History:  Diagnosis Date   Allergy    Anxiety    Chronic kidney disease    Depression    Hypertension      Past Surgical History: Past Surgical History:  Procedure Laterality Date   NASAL SEPTUM SURGERY  1999    Allergies: Allergies  Allergen Reactions   Shellfish Allergy Anaphylaxis    Outpatient Meds: Current Outpatient Medications  Medication Sig Dispense Refill   amLODipine (NORVASC) 10 MG tablet TAKE 1 TABLET BY MOUTH EVERY DAY 30 tablet 0   Azelastine HCl 137 MCG/SPRAY SOLN PLACE 1 SPRAY INTO BOTH NOSTRILS 2 (TWO) TIMES DAILY. USE IN EACH NOSTRIL AS DIRECTED 90 mL 0   fluticasone (FLONASE) 50 MCG/ACT nasal spray PLACE 2 SPRAYS INTO BOTH NOSTRILS DAILY. PLACE 2 SPRAYS INTO BOTH NOSTRILS DAILY. 16 mL 5   lamoTRIgine (LAMICTAL) 150 MG tablet Take 300 mg by mouth daily.      montelukast (SINGULAIR) 10 MG tablet Take 10 mg by mouth at bedtime.      Multiple Vitamin (MULTIVITAMIN) tablet Take 1 tablet by mouth daily.     omeprazole (PRILOSEC) 20 MG capsule TAKE 1 CAPSULE BY MOUTH EVERY DAY 90 capsule 1   paliperidone (INVEGA) 6 MG 24 hr tablet Take 6 mg by mouth at bedtime.     QUEtiapine (SEROQUEL) 25 MG tablet Take 12.5-25 mg by mouth at bedtime.     acetaminophen (TYLENOL) 500 MG tablet Take 2 tablets (1,000 mg total) by mouth every 6 (six) hours as needed. 30 tablet 0   cetirizine (ZYRTEC) 10 MG tablet Take 10 mg by mouth daily.     EPIPEN 2-PAK 0.3 MG/0.3ML SOAJ injection Inject 0.3 mg into the muscle once.      ondansetron (ZOFRAN) 4 MG  tablet Take 4 mg by mouth 2 (two) times daily as needed.     Current Facility-Administered Medications  Medication Dose Route Frequency Provider Last Rate Last Admin   0.9 %  sodium chloride infusion  500 mL Intravenous Once Doran Stabler, MD          ___________________________________________________________________ Objective   Exam:  BP (!) 142/88   Pulse 82   Temp 98.2 F (36.8 C) (Temporal)   Resp 13   Ht 5' 7"$  (1.702 m)   Wt 205 lb (93 kg)   SpO2 95%   BMI 32.11 kg/m   CV: regular , S1/S2 Resp: clear to auscultation bilaterally, normal RR and effort noted GI: soft, no tenderness, with active bowel sounds.   Assessment: Encounter Diagnosis  Name Primary?   Special screening for malignant neoplasms, colon Yes     Plan: Colonoscopy  The benefits and risks of the planned procedure were described in detail with the patient or (when appropriate) their health care proxy.  Risks were outlined as including, but not limited to, bleeding, infection, perforation, adverse medication reaction leading to cardiac or pulmonary decompensation, pancreatitis (if ERCP).  The limitation of incomplete mucosal visualization was also discussed.  No guarantees or warranties were given.    The patient is appropriate for an endoscopic procedure in the  ambulatory setting.   - Wilfrid Lund, MD

## 2022-12-12 NOTE — Op Note (Signed)
[place] Patient Name: Dustin Solis Procedure Date: 12/12/2022 3:11 PM MRN: [id number] Endoscopist: [place]. Loletha Carrow , MD, [id number] Age: 52 Referring MD:  Date of Birth: 1971/07/16 Gender: Male Account #: [id number] Procedure:                Colonoscopy Indications:              Screening for colorectal malignant neoplasm, This                            is the patient's first colonoscopy                           Denies chronic abdominal pain, altered bowel habits                            or rectal bleeding. Medicines:                Monitored Anesthesia Care Procedure:                Pre-Anesthesia Assessment:                           - Prior to the procedure, a History and Physical                            was performed, and patient medications and                            allergies were reviewed. The patient's tolerance of                            previous anesthesia was also reviewed. The risks                            and benefits of the procedure and the sedation                            options and risks were discussed with the patient.                            All questions were answered, and informed consent                            was obtained. Prior Anticoagulants: The patient has                            taken no anticoagulant or antiplatelet agents. ASA                            Grade Assessment: II - A patient with mild systemic                            disease. After reviewing the risks and benefits,  the patient was deemed in satisfactory condition to                            undergo the procedure.                           After obtaining informed consent, the colonoscope                            was passed under direct vision. Throughout the                            procedure, the patient's blood pressure, pulse, and                            oxygen saturations were monitored continuously. The                             CF [id number] was introduced through the anus                            and advanced to the the terminal ileum, with                            identification of the appendiceal orifice and IC                            valve. The colonoscopy was performed without                            difficulty. The patient tolerated the procedure                            well. The quality of the bowel preparation was                            excellent. The terminal ileum, ileocecal valve,                            appendiceal orifice, and rectum were photographed. Scope In: 3:28:49 PM Scope Out: 3:45:10 PM Scope Withdrawal Time: 0 hours 13 minutes 12 seconds  Total Procedure Duration: 0 hours 16 minutes 21 seconds  Findings:                 The perianal and digital rectal examinations were                            normal.                           The terminal ileum appeared normal.                           Repeat examination of right colon under NBI  performed.                           A diminutive polyp was found in the ascending                            colon. The polyp was flat. The polyp was removed                            with a cold snare. Resection and retrieval were                            complete.                           An area of moderately congested, erythematous,                            granular and inflamed mucosa with adherent white                            exudate was found in the rectum, in the sigmoid                            colon, in the proximal ascending colon and in the                            cecum. This process was confluent in the cecum and                            most proximal ascending colon. It was patchy                            through the mid to distal sigmoid, and then fairly                            confluent in the proximal to mid rectum. (See                             photos). Several biopsies were obtained in the                            rectum and in the cecum with cold forceps for                            histology. (Separate jars for proximal colon and                            rectum)                           A few diverticula were found in the left colon.  The exam was otherwise without abnormality on                            direct and retroflexion views. Complications:            No immediate complications. Estimated Blood Loss:     Estimated blood loss was minimal. Impression:               - The examined portion of the ileum was normal.                           - One diminutive polyp in the ascending colon,                            removed with a cold snare. Resected and retrieved.                           - Congested, erythematous, granular and inflamed                            mucosa in the rectum, in the sigmoid colon, in the                            proximal ascending colon and in the cecum. This has                            the appearance of IBD, though this patient appears                            to be asymptomatic.                           - Diverticulosis in the left colon.                           - The examination was otherwise normal on direct                            and retroflexion views.                           - Several biopsies were obtained in the rectum and                            in the cecum. Recommendation:           - Patient has a contact number available for                            emergencies. The signs and symptoms of potential                            delayed complications were discussed with the  patient. Return to normal activities tomorrow.                            Written discharge instructions were provided to the                            patient.                           - Resume previous diet.                            - Continue present medications.                           - Await pathology results.                           - Repeat colonoscopy is recommended for                            surveillance. The colonoscopy date will be                            determined after pathology results from today's                            exam become available for review.                           - Return to my office at appointment to be                            scheduled to discuss pathology results and possible                            treatment.Marland Kitchen Brenetta Penny L. Loletha Carrow, MD 12/12/2022 3:54:17 PM This report has been signed electronically.

## 2022-12-12 NOTE — Progress Notes (Signed)
Report to PACU, RN, vss, BBS= Clear.  

## 2022-12-12 NOTE — Progress Notes (Signed)
Pt's states no medical or surgical changes since previsit or office visit. 

## 2022-12-12 NOTE — Progress Notes (Signed)
Called to room to assist during endoscopic procedure.  Patient ID and intended procedure confirmed with present staff. Received instructions for my participation in the procedure from the performing physician.  

## 2022-12-13 ENCOUNTER — Telehealth: Payer: Self-pay

## 2022-12-13 NOTE — Telephone Encounter (Signed)
Per 12/12/22 - return to my office at appointment to be scheduled to discuss pathology results.  Lm on vm for patient to return call.

## 2022-12-13 NOTE — Telephone Encounter (Signed)
Left message on follow up call. 

## 2022-12-16 NOTE — Telephone Encounter (Signed)
Lm on vm for patient to return call to schedule follow up appt with Dr. Loletha Carrow.

## 2022-12-17 NOTE — Telephone Encounter (Signed)
Patient's wife returned call. Pt was scheduled for a f/u with Dr. Loletha Carrow on Tuesday, 02/04/23 at 9:20 am.

## 2022-12-20 ENCOUNTER — Encounter: Payer: Self-pay | Admitting: Gastroenterology

## 2022-12-24 ENCOUNTER — Other Ambulatory Visit: Payer: Self-pay | Admitting: Family Medicine

## 2022-12-24 NOTE — Telephone Encounter (Signed)
Prescription Request  12/24/2022  Is this a "Controlled Substance" medicine? No  LOV: 11/07/2022  What is the name of the medication or equipment? amLODipine (NORVASC) 10 MG tablet    Have you contacted your pharmacy to request a refill? Yes   Which pharmacy would you like this sent to?  [id number] [place]Phone: [phone]: [phone]   Patient notified that their request is being sent to the clinical staff for review and that they should receive a response within 2 business days.   [phone]

## 2022-12-25 ENCOUNTER — Encounter: Payer: Self-pay | Admitting: Gastroenterology

## 2022-12-25 NOTE — Telephone Encounter (Signed)
Refilled 12/11/22, too early.

## 2022-12-27 ENCOUNTER — Other Ambulatory Visit: Payer: Self-pay

## 2022-12-27 DIAGNOSIS — N183 Chronic kidney disease, stage 3 unspecified: Secondary | ICD-10-CM

## 2022-12-27 MED ORDER — AMLODIPINE BESYLATE 10 MG PO TABS: 10.0000 mg | ORAL_TABLET | Freq: Every day | ORAL | 3 refills | Status: DC

## 2023-01-07 ENCOUNTER — Encounter: Payer: Self-pay | Admitting: Family Medicine

## 2023-01-07 ENCOUNTER — Ambulatory Visit: Payer: Managed Care, Other (non HMO) | Admitting: Family Medicine

## 2023-01-07 VITALS — BP 114/62 | HR 89 | Temp 97.5°F | Ht 67.0 in | Wt 206.0 lb

## 2023-01-07 DIAGNOSIS — Z9109 Other allergy status, other than to drugs and biological substances: Secondary | ICD-10-CM

## 2023-01-07 MED ORDER — PREDNISONE 20 MG PO TABS: ORAL_TABLET | ORAL | 0 refills | Status: DC

## 2023-01-07 NOTE — Progress Notes (Signed)
Subjective:    Patient ID: Dustin Solis, male    DOB: 03/22/1971, 52 y.o.   MRN: [id number]  HPI Symptoms began over 2 weeks ago.  Symptoms include head congestion, rhinorrhea, the inability to breathe through his nose.  He states that he has lost his sense of smell.  He is lost his sense of taste because he cannot breathe through his nose.  He has been taking Singulair, Flonase, and Astelin without relief.  He has a history of severe allergies.  He has not been taking his Zyrtec for the last week.  He went to an urgent care 1 week ago where they performed a COVID test which was negative.  He denies any fevers or chills.  He denies any body aches.  He denies any cough or pleurisy.  He does occasionally have some pressure in his right frontal sinus.  He also has been blowing out green and yellow nasal mucus over the last few days.  He denies any pain in his teeth. Past Medical History:  Diagnosis Date   Allergy    Anxiety    Chronic kidney disease    Depression    Hypertension    Past Surgical History:  Procedure Laterality Date   NASAL SEPTUM SURGERY  1999   Current Outpatient Medications on File Prior to Visit  Medication Sig Dispense Refill   acetaminophen (TYLENOL) 500 MG tablet Take 2 tablets (1,000 mg total) by mouth every 6 (six) hours as needed. 30 tablet 0   amLODipine (NORVASC) 10 MG tablet Take 1 tablet (10 mg total) by mouth daily. 30 tablet 3   Azelastine HCl 137 MCG/SPRAY SOLN PLACE 1 SPRAY INTO BOTH NOSTRILS 2 (TWO) TIMES DAILY. USE IN EACH NOSTRIL AS DIRECTED 90 mL 0   cetirizine (ZYRTEC) 10 MG tablet Take 10 mg by mouth daily.     EPIPEN 2-PAK 0.3 MG/0.3ML SOAJ injection Inject 0.3 mg into the muscle once.      fluticasone (FLONASE) 50 MCG/ACT nasal spray PLACE 2 SPRAYS INTO BOTH NOSTRILS DAILY. PLACE 2 SPRAYS INTO BOTH NOSTRILS DAILY. 16 mL 5   lamoTRIgine (LAMICTAL) 150 MG tablet Take 300 mg by mouth daily.      montelukast (SINGULAIR) 10 MG tablet Take 10 mg by  mouth at bedtime.      Multiple Vitamin (MULTIVITAMIN) tablet Take 1 tablet by mouth daily.     omeprazole (PRILOSEC) 20 MG capsule TAKE 1 CAPSULE BY MOUTH EVERY DAY 90 capsule 1   ondansetron (ZOFRAN) 4 MG tablet Take 4 mg by mouth 2 (two) times daily as needed.     paliperidone (INVEGA) 6 MG 24 hr tablet Take 6 mg by mouth at bedtime.     QUEtiapine (SEROQUEL) 25 MG tablet Take 12.5-25 mg by mouth at bedtime.     No current facility-administered medications on file prior to visit.   Allergies  Allergen Reactions   Shellfish Allergy Anaphylaxis   Social History   Socioeconomic History   Marital status: Married    Spouse name: Not on file   Number of children: Not on file   Years of education: Not on file   Highest education level: Not on file  Occupational History   Not on file  Tobacco Use   Smoking status: Never   Smokeless tobacco: Never  Substance and Sexual Activity   Alcohol use: No   Drug use: No   Sexual activity: Not on file  Other Topics Concern   Not on file  Social History Narrative   Not on file   Social Determinants of Health   Financial Resource Strain: Not on file  Food Insecurity: Not on file  Transportation Needs: Not on file  Physical Activity: Not on file  Stress: Not on file  Social Connections: Not on file  Intimate Partner Violence: Not on file   Family History  Problem Relation Age of Onset   Cancer Mother        lung   Cancer Father        prostate cancer   Hypertension Father    Hyperlipidemia Father    Hypertension Brother    Hyperlipidemia Brother    Colon cancer Neg Hx    Esophageal cancer Neg Hx    Stomach cancer Neg Hx    Rectal cancer Neg Hx        Review of Systems  All other systems reviewed and are negative.      Objective:   Physical Exam Constitutional:      General: He is not in acute distress.    Appearance: Normal appearance. He is not ill-appearing, toxic-appearing or diaphoretic.  HENT:     Head:  Normocephalic and atraumatic.     Right Ear: Tympanic membrane, ear canal and external ear normal. There is no impacted cerumen.     Left Ear: Tympanic membrane, ear canal and external ear normal. There is no impacted cerumen.     Nose: Congestion and rhinorrhea present.     Right Turbinates: Swollen.     Left Turbinates: Swollen.     Right Sinus: No maxillary sinus tenderness or frontal sinus tenderness.     Left Sinus: No maxillary sinus tenderness or frontal sinus tenderness.     Mouth/Throat:     Mouth: Mucous membranes are moist.     Pharynx: No oropharyngeal exudate or posterior oropharyngeal erythema.  Cardiovascular:     Rate and Rhythm: Normal rate and regular rhythm.     Pulses: Normal pulses.     Heart sounds: Normal heart sounds. No murmur heard.    No friction rub. No gallop.  Pulmonary:     Effort: Pulmonary effort is normal. No respiratory distress.     Breath sounds: Normal breath sounds. No stridor. No wheezing, rhonchi or rales.  Chest:     Chest wall: No tenderness.  Musculoskeletal:     Cervical back: No muscular tenderness.  Skin:    General: Skin is warm.     Coloration: Skin is not jaundiced or pale.     Findings: No bruising, erythema, lesion or rash.  Neurological:     Mental Status: He is alert.    .     Assessment & Plan:  Environmental allergies I believe that the patient is dealing with allergies rather than a sinus infection based on his previous history.  Begin with a prednisone taper pack to try to calm his allergies.  Resume his antihistamine.  If symptoms gradually improve over the next week no further treatment is necessary.  If worsening, I would likely add an antibiotic for possible secondary sinus infection.

## 2023-02-04 ENCOUNTER — Ambulatory Visit (INDEPENDENT_AMBULATORY_CARE_PROVIDER_SITE_OTHER): Payer: Managed Care, Other (non HMO) | Admitting: Gastroenterology

## 2023-02-04 ENCOUNTER — Encounter: Payer: Self-pay | Admitting: Gastroenterology

## 2023-02-04 VITALS — BP 108/70 | HR 96 | Ht 67.0 in | Wt 208.1 lb

## 2023-02-04 DIAGNOSIS — K529 Noninfective gastroenteritis and colitis, unspecified: Secondary | ICD-10-CM

## 2023-02-04 MED ORDER — MESALAMINE 1.2 G PO TBEC: 2.4000 g | DELAYED_RELEASE_TABLET | Freq: Every day | ORAL | 1 refills | Status: AC

## 2023-02-04 NOTE — Patient Instructions (Signed)
_______________________________________________________  If your blood pressure at your visit was 140/90 or greater, please contact your primary care physician to follow up on this.  _______________________________________________________  If you are age 54 or older, your body mass index should be between 23-30. Your Body mass index is 32.6 kg/m. If this is out of the aforementioned range listed, please consider follow up with your Primary Care Provider.  If you are age 80 or younger, your body mass index should be between 19-25. Your Body mass index is 32.6 kg/m. If this is out of the aformentioned range listed, please consider follow up with your Primary Care Provider.   ________________________________________________________  The [facility] providers would like to encourage you to use [facility] to communicate with providers for non-urgent requests or questions.  Due to long hold times on the telephone, sending your provider a message by [facility] may be a faster and more efficient way to get a response.  Please allow 48 business hours for a response.  Please remember that this is for non-urgent requests.  _______________________________________________________  It was a pleasure to see you today!  Thank you for trusting me with your gastrointestinal care!

## 2023-02-04 NOTE — Progress Notes (Signed)
[facility] Progress Note  Chief Complaint: Diarrhea  Subjective  History: Dustin Solis follows up after his recent screening colonoscopy, at which time he was not describing any chronic digestive symptoms.  Diminutive polyp removed, and also the following:  "An area of moderately congested, erythematous, granular and inflamed mucosa with adherent white exudate was found in the rectum, in the sigmoid colon, in the proximal ascending colon and in the cecum. This process was confluent in the cecum and most proximal ascending colon. It was patchy through the mid to distal sigmoid, and then fairly confluent in the proximal to mid rectum. (See photos). Several biopsies were obtained in the rectum and in the cecum with cold forceps for histology. (Separate jars for proximal colon and rectum) "  Dustin Solis says he had not noticed diarrhea or other abnormality in bowel habits until after the colonoscopy.  Since then, he has had loose stool about half the time.  It is semiformed to mushy, no blood.  His stomach symptoms rumbles and makes noise.  He consumes little dairy (milk or cheese), and has not noticed any clear food triggers. ROS: Cardiovascular:  no chest pain Respiratory: no dyspnea  The patient's Past Medical, Family and Social History were reviewed and are on file in the EMR.  Objective:  Med list reviewed  Current Outpatient Medications:    acetaminophen (TYLENOL) 500 MG tablet, Take 2 tablets (1,000 mg total) by mouth every 6 (six) hours as needed., Disp: 30 tablet, Rfl: 0   amLODipine (NORVASC) 10 MG tablet, Take 1 tablet (10 mg total) by mouth daily., Disp: 30 tablet, Rfl: 3   Azelastine HCl 137 MCG/SPRAY SOLN, PLACE 1 SPRAY INTO BOTH NOSTRILS 2 (TWO) TIMES DAILY. USE IN EACH NOSTRIL AS DIRECTED, Disp: 90 mL, Rfl: 0   cetirizine (ZYRTEC) 10 MG tablet, Take 10 mg by mouth daily., Disp: , Rfl:    EPIPEN 2-PAK 0.3 MG/0.3ML SOAJ injection, Inject 0.3 mg into the muscle once. , Disp: , Rfl:     fluticasone (FLONASE) 50 MCG/ACT nasal spray, PLACE 2 SPRAYS INTO BOTH NOSTRILS DAILY. PLACE 2 SPRAYS INTO BOTH NOSTRILS DAILY., Disp: 16 mL, Rfl: 5   lamoTRIgine (LAMICTAL) 150 MG tablet, Take 300 mg by mouth daily. , Disp: , Rfl:    mesalamine (LIALDA) 1.2 g EC tablet, Take 2 tablets (2.4 g total) by mouth daily with breakfast., Disp: 60 tablet, Rfl: 1   montelukast (SINGULAIR) 10 MG tablet, Take 10 mg by mouth at bedtime. , Disp: , Rfl:    Multiple Vitamin (MULTIVITAMIN) tablet, Take 1 tablet by mouth daily., Disp: , Rfl:    omeprazole (PRILOSEC) 20 MG capsule, TAKE 1 CAPSULE BY MOUTH EVERY DAY, Disp: 90 capsule, Rfl: 1   ondansetron (ZOFRAN) 4 MG tablet, Take 4 mg by mouth 2 (two) times daily as needed., Disp: , Rfl:    paliperidone (INVEGA) 6 MG 24 hr tablet, Take 6 mg by mouth at bedtime., Disp: , Rfl:    QUEtiapine (SEROQUEL) 25 MG tablet, Take 12.5-25 mg by mouth at bedtime., Disp: , Rfl:    Vital signs in last 24 hrs: Vitals:   02/04/23 0932  BP: 108/70  Pulse: 96   Wt Readings from Last 3 Encounters:  02/04/23 208 lb 2 oz (94.4 kg)  01/07/23 206 lb (93.4 kg)  12/12/22 205 lb (93 kg)    Physical Exam Well-appearing No additional exam-entire visit spent in review of symptoms, records and plan Pathology results:  1. Surgical [P], colon,  cecum - COLONIC MUCOSA WITH PROMINENT LYMPHOID AGGREGATES, FOCAL MILD ACTIVITY AND REACTIVE/REPARATIVE CHANGE. NOTE: THERE IS MINIMAL ARCHITECTURAL DISARRAY BUT WITHOUT PATHOGNOMONIC FINDINGS FOR CHRONICITY. THERE IS NO EVIDENCE OF GRANULOMAS, VIRAL CYTOPATHIC EFFECT OR DYSPLASIA. 2. Surgical [P], colon, ascending, polyp (1) - CONSISTENT WITH A SESSILE SERRATED LESION. 3. Surgical [P], colon, rectum - COLONIC MUCOSA WITH MINIMAL ARCHITECTURAL DISARRAY AND MILD ACTIVITY. NOTE: WHILE THE FINDINGS ARE NOT PATHOGNOMONIC IN THE APPROPRIATE CLINICAL SETTING THE FINDINGS COULD REPRESENT THE CLINICAL SUSPICION OF ULCERATIVE COLITIS WITH CECAL  PATCH. THERE IS NO EVIDENCE OF GRANULOMAS, VIRAL CYTOPATHIC EFFECT OR DYSPLASIA.  _____________________________________________ Assessment & Plan  Assessment: Encounter Diagnoses  Name Primary?   Inflammation of colonic mucosa Yes   Chronic diarrhea    It still may be the case that this is a nonspecific colonic mucosal abnormality, though it is possible we just happened to catch the early stages of a chronic colitis condition.  He was not having diarrhea before the colonoscopy, so not entirely clear why he is now.  We discussed options of watching and waiting or trying him on a 12-month course of mesalamine to see if there is any change in the bowel habits.  Try some medicine as a relatively low risk plan for him, as I do not think it will interfere with his other medicines and has a low side effect profile. Plan: Lialda 1.2 g tablet, 2 tablets every morning for 8 weeks  I encouraged him to send me a portal message or call toward the end of that course with an update on how he is feeling.  We can then decide whether or not to continue it or stop it and see how his symptoms go.  Follow-up office visit was offered, but he would prefer to at least start by checking in on phone or portal message.  23 minutes were spent on this encounter (including chart review, history/exam, counseling/coordination of care, and documentation) > 50% of that time was spent on counseling and coordination of care.   Charlie Pitter III

## 2023-04-24 ENCOUNTER — Other Ambulatory Visit: Payer: Self-pay | Admitting: Family Medicine

## 2023-04-24 DIAGNOSIS — N183 Chronic kidney disease, stage 3 unspecified: Secondary | ICD-10-CM

## 2023-04-25 NOTE — Telephone Encounter (Signed)
Requested Prescriptions  Pending Prescriptions Disp Refills   amLODipine (NORVASC) 10 MG tablet [Pharmacy Med Name: AMLODIPINE BESYLATE 10MG  TABLETS] 90 tablet 0    Sig: TAKE 1 TABLET(10 MG) BY MOUTH DAILY     Cardiovascular: Calcium Channel Blockers 2 Failed - 04/24/2023  2:11 PM      Failed - Valid encounter within last 6 months    Recent Outpatient Visits           1 year ago Atypical chest pain   [facility] Donita Brooks, MD   2 years ago Stage 3 chronic kidney disease, unspecified whether stage 3a or 3b CKD (HCC)   [facility] Pickard, Priscille Heidelberg, MD   3 years ago Left wrist pain   [facility] Jenne Pane, Crystal A, FNP   3 years ago Stage 3 chronic kidney disease, unspecified whether stage 3a or 3b CKD   [facility] Pickard, Priscille Heidelberg, MD   3 years ago General medical exam   [facility] Donita Brooks, MD              Passed - Last BP in normal range    BP Readings from Last 1 Encounters:  02/04/23 108/70         Passed - Last Heart Rate in normal range    Pulse Readings from Last 1 Encounters:  02/04/23 96

## 2023-05-26 ENCOUNTER — Encounter: Payer: Self-pay | Admitting: Family Medicine

## 2023-05-26 ENCOUNTER — Ambulatory Visit (INDEPENDENT_AMBULATORY_CARE_PROVIDER_SITE_OTHER): Payer: Managed Care, Other (non HMO) | Admitting: Family Medicine

## 2023-05-26 VITALS — BP 120/76 | HR 87 | Temp 97.9°F | Ht 67.0 in | Wt 208.0 lb

## 2023-05-26 DIAGNOSIS — E291 Testicular hypofunction: Secondary | ICD-10-CM | POA: Diagnosis not present

## 2023-05-26 DIAGNOSIS — G4710 Hypersomnia, unspecified: Secondary | ICD-10-CM

## 2023-05-26 DIAGNOSIS — S46002A Unspecified injury of muscle(s) and tendon(s) of the rotator cuff of left shoulder, initial encounter: Secondary | ICD-10-CM

## 2023-05-26 MED ORDER — MECLIZINE HCL 25 MG PO TABS: 25.0000 mg | ORAL_TABLET | Freq: Three times a day (TID) | ORAL | 0 refills | Status: DC | PRN

## 2023-05-26 NOTE — Progress Notes (Signed)
Subjective:    Patient ID: Dustin Solis, male    DOB: May 30, 1971, 52 y.o.   MRN: [id number]  Dizziness  Shoulder Pain    Patient presents today with several concerns.  First he has a history of vestibular nerve dysfunction.  This is characterized by dizziness.  He has seen ENT and they have recommended Epley maneuvers treated.  Recently over the last month, he states that he has had more dizziness than usual.  He reports feeling unsteady on his feet walking down steps.  He reports feeling like the room is moving sometimes when he turns his head while reading in bed.  He denies any hearing loss.  He denies any headaches.  He denies any blurry vision or double vision.  He denies any neurologic deficits.  He also complains of chronic left shoulder pain.  The patient reports pain with abduction greater than 90 degrees.  He denies any pain with internal and external rotation.  He has very good strength with resisted abduction but he does have pain with resisted abduction.  He has negative Hawkins sign.  The pain is located in the subacromial space suggesting tendinitis or bursitis.  He also reports hypersomnolence.  He states that no matter how much he sleeps he feels extremely sleepy and tired.  He is interested in a sleep study to evaluate for sleep apnea.  He denies any witnessed apneic episodes.  He also is interested in treatment for hypogonadism.  Patient had low bioavailable testosterone on the recent lab.  He reports fatigue.  He would like to repeat the testosterone to see if this is persistent and warrants treatment Past Medical History:  Diagnosis Date   Allergy    Anxiety    Chronic kidney disease    Depression    Hypertension    Past Surgical History:  Procedure Laterality Date   NASAL SEPTUM SURGERY  1999   Current Outpatient Medications on File Prior to Visit  Medication Sig Dispense Refill   acetaminophen (TYLENOL) 500 MG tablet Take 2 tablets (1,000 mg total) by mouth  every 6 (six) hours as needed. 30 tablet 0   amLODipine (NORVASC) 10 MG tablet TAKE 1 TABLET(10 MG) BY MOUTH DAILY 90 tablet 0   Azelastine HCl 137 MCG/SPRAY SOLN PLACE 1 SPRAY INTO BOTH NOSTRILS 2 (TWO) TIMES DAILY. USE IN EACH NOSTRIL AS DIRECTED 90 mL 0   cetirizine (ZYRTEC) 10 MG tablet Take 10 mg by mouth daily.     EPIPEN 2-PAK 0.3 MG/0.3ML SOAJ injection Inject 0.3 mg into the muscle once.      fluticasone (FLONASE) 50 MCG/ACT nasal spray PLACE 2 SPRAYS INTO BOTH NOSTRILS DAILY. PLACE 2 SPRAYS INTO BOTH NOSTRILS DAILY. 16 mL 5   lamoTRIgine (LAMICTAL) 150 MG tablet Take 300 mg by mouth daily.      mesalamine (LIALDA) 1.2 g EC tablet Take 2 tablets (2.4 g total) by mouth daily with breakfast. 60 tablet 1   montelukast (SINGULAIR) 10 MG tablet Take 10 mg by mouth at bedtime.      Multiple Vitamin (MULTIVITAMIN) tablet Take 1 tablet by mouth daily.     omeprazole (PRILOSEC) 20 MG capsule TAKE 1 CAPSULE BY MOUTH EVERY DAY 90 capsule 1   ondansetron (ZOFRAN) 4 MG tablet Take 4 mg by mouth 2 (two) times daily as needed.     paliperidone (INVEGA) 6 MG 24 hr tablet Take 6 mg by mouth at bedtime.     QUEtiapine (SEROQUEL) 25 MG tablet Take  12.5-25 mg by mouth at bedtime.     No current facility-administered medications on file prior to visit.   Allergies  Allergen Reactions   Shellfish Allergy Anaphylaxis   Social History   Socioeconomic History   Marital status: Married    Spouse name: Not on file   Number of children: 2   Years of education: Not on file   Highest education level: Not on file  Occupational History   Not on file  Tobacco Use   Smoking status: Never   Smokeless tobacco: Never  Vaping Use   Vaping status: Never Used  Substance and Sexual Activity   Alcohol use: No   Drug use: No   Sexual activity: Not on file  Other Topics Concern   Not on file  Social History Narrative   Not on file   Social Determinants of Health   Financial Resource Strain: Not on file   Food Insecurity: Not on file  Transportation Needs: Not on file  Physical Activity: Not on file  Stress: Not on file  Social Connections: Unknown (03/12/2022)   Received from [facility]   Social Network    Social Network: Not on file  Intimate Partner Violence: Unknown (02/01/2022)   Received from [facility]   HITS    Physically Hurt: Not on file    Insult or Talk Down To: Not on file    Threaten Physical Harm: Not on file    Scream or Curse: Not on file   Family History  Problem Relation Age of Onset   Lung cancer Mother    Prostate cancer Father    Hypertension Father    Hyperlipidemia Father    Hypertension Brother    Hyperlipidemia Brother    Colon cancer Neg Hx    Esophageal cancer Neg Hx    Stomach cancer Neg Hx    Rectal cancer Neg Hx        Review of Systems  Neurological:  Positive for dizziness.  All other systems reviewed and are negative.      Objective:   Physical Exam Constitutional:      General: He is not in acute distress.    Appearance: Normal appearance. He is not ill-appearing, toxic-appearing or diaphoretic.  HENT:     Head: Normocephalic and atraumatic.     Right Ear: Tympanic membrane, ear canal and external ear normal. There is no impacted cerumen.     Left Ear: Tympanic membrane, ear canal and external ear normal. There is no impacted cerumen.     Mouth/Throat:     Mouth: Mucous membranes are moist.     Pharynx: No oropharyngeal exudate or posterior oropharyngeal erythema.  Cardiovascular:     Rate and Rhythm: Normal rate and regular rhythm.     Pulses: Normal pulses.     Heart sounds: Normal heart sounds. No murmur heard.    No friction rub. No gallop.  Pulmonary:     Effort: Pulmonary effort is normal. No respiratory distress.     Breath sounds: Normal breath sounds. No stridor. No wheezing, rhonchi or rales.  Chest:     Chest wall: No tenderness.  Musculoskeletal:     Left shoulder: No  tenderness. Decreased range of motion. Normal strength.     Cervical back: No muscular tenderness.  Skin:    General: Skin is warm.     Coloration: Skin is not jaundiced or pale.     Findings: No bruising, erythema, lesion or  rash.  Neurological:     Mental Status: He is alert.    .     Assessment & Plan:   Hypersomnolence - Plan: Ambulatory referral to Sleep Studies  Injury of muscle or tendon of left rotator cuff - Plan: Ambulatory referral to Physical Therapy  Hypogonadism in male - Plan: Prolactin, Testosterone Total,Free,Bio, Males, FSH/LH I believe the patient has tendinitis in the left supraspinatus muscle.  Consult physical therapy.  Recheck if no better after trying physical therapy.  Patient reports hypersomnolence.  Some of this I believe may be due to medication however symptoms have persisted for quite some time now.  Recommended a referral for sleep study to evaluate further.  Third, I will try the patient on meclizine for BPPV.  He can take 25 mg every 8 hours as needed for vertigo.  Fourth the patient had low free testosterone on his last lab visit although his total testosterone level was normal.  I will repeat a total and free testosterone along with a prolactin level, an FSH, and LH.  If testosterone is persistently low, we may start the patient on testosterone replacement

## 2023-08-12 ENCOUNTER — Encounter: Payer: Self-pay | Admitting: Family Medicine

## 2023-08-13 ENCOUNTER — Other Ambulatory Visit: Payer: Self-pay | Admitting: Family Medicine

## 2023-08-13 DIAGNOSIS — N183 Chronic kidney disease, stage 3 unspecified: Secondary | ICD-10-CM

## 2023-08-13 NOTE — Telephone Encounter (Signed)
Requested Prescriptions  Pending Prescriptions Disp Refills   amLODipine (NORVASC) 10 MG tablet [Pharmacy Med Name: AMLODIPINE BESYLATE 10MG  TABLETS] 90 tablet 0    Sig: TAKE 1 TABLET(10 MG) BY MOUTH DAILY     Cardiovascular: Calcium Channel Blockers 2 Failed - 08/13/2023 11:43 AM      Failed - Valid encounter within last 6 months    Recent Outpatient Visits           2 years ago Atypical chest pain   [facility] Donita Brooks, MD   2 years ago Stage 3 chronic kidney disease, unspecified whether stage 3a or 3b CKD (HCC)   [facility] Pickard, Priscille Heidelberg, MD   3 years ago Left wrist pain   [facility] Jenne Pane, Crystal A, FNP   3 years ago Stage 3 chronic kidney disease, unspecified whether stage 3a or 3b CKD   [facility] Pickard, Priscille Heidelberg, MD   4 years ago General medical exam   [facility] Donita Brooks, MD              Passed - Last BP in normal range    BP Readings from Last 1 Encounters:  05/26/23 120/76         Passed - Last Heart Rate in normal range    Pulse Readings from Last 1 Encounters:  05/26/23 87

## 2023-08-19 ENCOUNTER — Other Ambulatory Visit: Payer: Managed Care, Other (non HMO)

## 2023-08-19 DIAGNOSIS — R5383 Other fatigue: Secondary | ICD-10-CM

## 2023-08-19 DIAGNOSIS — N181 Chronic kidney disease, stage 1: Secondary | ICD-10-CM

## 2023-08-20 LAB — LIPID PANEL
Cholesterol: 159 mg/dL (ref <200)
HDL: 55 mg/dL (ref >=40)
LDL Cholesterol (Calc): 85 mg/dL
Non-HDL Cholesterol (Calc): 104 mg/dL (ref <130)
Total CHOL/HDL Ratio: 2.9 (calc) (ref <5.0)
Triglycerides: 101 mg/dL (ref <150)

## 2023-08-20 LAB — VITAMIN D 25 HYDROXY (VIT D DEFICIENCY, FRACTURES): Vit D, 25-Hydroxy: 34 ng/mL (ref 30–100)

## 2023-09-08 ENCOUNTER — Ambulatory Visit (INDEPENDENT_AMBULATORY_CARE_PROVIDER_SITE_OTHER): Payer: Managed Care, Other (non HMO) | Admitting: Neurology

## 2023-09-08 ENCOUNTER — Encounter: Payer: Self-pay | Admitting: Neurology

## 2023-09-08 VITALS — BP 125/86 | HR 83 | Wt 210.0 lb

## 2023-09-08 DIAGNOSIS — G4769 Other sleep related movement disorders: Secondary | ICD-10-CM

## 2023-09-08 DIAGNOSIS — Z6832 Body mass index (BMI) 32.0-32.9, adult: Secondary | ICD-10-CM | POA: Diagnosis not present

## 2023-09-08 DIAGNOSIS — R0683 Snoring: Secondary | ICD-10-CM

## 2023-09-08 DIAGNOSIS — F19982 Other psychoactive substance use, unspecified with psychoactive substance-induced sleep disorder: Secondary | ICD-10-CM | POA: Diagnosis not present

## 2023-09-08 DIAGNOSIS — R5383 Other fatigue: Secondary | ICD-10-CM | POA: Diagnosis not present

## 2023-09-08 NOTE — Patient Instructions (Signed)
Quality Sleep Information, Adult Quality sleep is important for your mental and physical health. It also improves your quality of life. Quality sleep means you: Are asleep for most of the time you are in bed. Fall asleep within 30 minutes. Wake up no more than once a night. Are awake for no longer than 20 minutes if you do wake up during the night. Most adults need 7-8 hours of quality sleep each night. How can poor sleep affect me? If you do not get enough quality sleep, you may have: Mood swings. Daytime sleepiness. Decreased alertness, reaction time, and concentration. Sleep disorders, such as insomnia and sleep apnea. Difficulty with: Solving problems. Coping with stress. Paying attention. These issues may affect your performance and productivity at work, school, and home. Lack of sleep may also put you at higher risk for accidents, suicide, and risky behaviors. If you do not get quality sleep, you may also be at higher risk for several health problems, including: Infections. Type 2 diabetes. Heart disease. High blood pressure. Obesity. Worsening of long-term conditions, like arthritis, kidney disease, depression, Parkinson's disease, and epilepsy. What actions can I take to get more quality sleep? Sleep schedule and routine Stick to a sleep schedule. Go to sleep and wake up at about the same time each day. Do not try to sleep less on weekdays and make up for lost sleep on weekends. This does not work. Limit naps during the day to 30 minutes or less. Do not take naps in the late afternoon. Make time to relax before bed. Reading, listening to music, or taking a hot bath promotes quality sleep. Make your bedroom a place that promotes quality sleep. Keep your bedroom dark, quiet, and at a comfortable room temperature. Make sure your bed is comfortable. Avoid using electronic devices that give off bright blue light for 30 minutes before bedtime. Your brain perceives bright blue light  as sunlight. This includes television, phones, and computers. If you are lying awake in bed for longer than 20 minutes, get up and do a relaxing activity until you feel sleepy. Lifestyle     Try to get at least 30 minutes of exercise on most days. Do not exercise 2-3 hours before going to bed. Do not use any products that contain nicotine or tobacco. These products include cigarettes, chewing tobacco, and vaping devices, such as e-cigarettes. If you need help quitting, ask your health care provider. Do not drink caffeinated beverages for at least 8 hours before going to bed. Coffee, tea, and some sodas contain caffeine. Do not drink alcohol or eat large meals close to bedtime. Try to get at least 30 minutes of sunlight every day. Morning sunlight is best. Medical concerns Work with your health care provider to treat medical conditions that may affect sleeping, such as: Nasal obstruction. Snoring. Sleep apnea and other sleep disorders. Talk to your health care provider if you think any of your prescription medicines may cause you to have difficulty falling or staying asleep. If you have sleep problems, talk with a sleep consultant. If you think you have a sleep disorder, talk with your health care provider about getting evaluated by a specialist. Where to find more information Sleep Foundation: sleepfoundation.org American Academy of Sleep Medicine: aasm.org Centers for Disease Control and Prevention (CDC): TonerPromos.no Contact a health care provider if: You have trouble getting to sleep or staying asleep. You often wake up very early in the morning and cannot get back to sleep. You have daytime sleepiness. You  have daytime sleep attacks of suddenly falling asleep and sudden muscle weakness (narcolepsy). You have a tingling sensation in your legs with a strong urge to move your legs (restless legs syndrome). You stop breathing briefly during sleep (sleep apnea). You think you have a sleep  disorder or are taking a medicine that is affecting your quality of sleep. Summary Most adults need 7-8 hours of quality sleep each night. Getting enough quality sleep is important for your mental and physical health. Make your bedroom a place that promotes quality sleep, and avoid things that may cause you to have poor sleep, such as alcohol, caffeine, smoking, or large meals. Talk to your health care provider if you have trouble falling asleep or staying asleep. This information is not intended to replace advice given to you by your health care provider. Make sure you discuss any questions you have with your health care provider. Document Revised: 02/06/2022 Document Reviewed: 02/06/2022 Elsevier Patient Education  2024 Elsevier Inc. SESSMENT AND PLAN 52 y.o. year old male patient with hypersomnia , prolonged sleep time, on medications - here with:    1)Chronic Insomnia treated with Seroquel, helping panic and anxiety attacks, and insomnia.  This converted the patient to a hyper-somnic individual, who is now sleeping 10-12 hors a day , prolonged sleep time and still fatigued- may be groggy?   2) There are risk factors for OSA, these are high grade Mallampati , a BMI over 32 and  abdominal obesity. Snoring when nasal allergies are acting up.   HST ordered. This patient will have an easier time sleeping at home an was given sleep quality measures. His sleep inertia in AM would not make him a good in -lab candidate.

## 2023-09-08 NOTE — Progress Notes (Signed)
SLEEP MEDICINE CLINIC    Provider:  Melvyn Novas, MD  Primary Care Physician:  Donita Brooks, MD [address] [facility] [address] [place] [address]     Referring Provider: Donita Brooks, Md [address] [place],  [place] [address]          Chief Complaint according to patient   Patient presents with:     New Patient (Initial Visit) Sleep consult            HISTORY OF PRESENT ILLNESS:  Dustin Solis is a 52 y.o. male patient who is seen upon PCP Dr. Tanya Nones referral on 09/08/2023 for Hypersomnolence.   Chief concern according to patient :  ' I am just tired for no reason. I am homebound anyway and take medications , most days I just feel fatigued, not necessarily sleepy. I get 8-10 hours of sleep".  .    Sleep relevant medical history: One time Nocturia/ anxiety,  sleep choking,  hyperventilating, dream enactment.  Nasal septoplasty . Severe respiratory allergies.    Family medical /sleep history: sister on CPAP with OSA, with atrial fibrillation.   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 and lives in a household with spouse, 2 children.  No pets.  Mother in law lives with them.  Tobacco use: none ,  ETOH use rarely,  Caffeine intake in form of Coffee( /) Soda( /) Tea ( rare ) no energy drinks. Routine Exercise - none    Sleep habits are as follows: The patient's dinner time is between 6-6.30 PM. The patient goes to bed at 11-12 PM in a bedroom described cool, quiet and he is reading on his I pad- and takes Seroquel- continues to sleep for 6-9 hours, wakes for one bathroom break. The preferred sleep position is prone , with the support of 2 pillows. Dreams are reportedly frequent/vivid.   The patient wakes up with an alarm. He is not waking up by himself-  10 AM is the usual rise time.  He reports not feeling refreshed or restored in AM, with symptoms such as dry mouth,  and residual fatigue.  Naps are not taken .   Review of  Systems: Out of a complete 14 system review, the patient complains of only the following symptoms, and all other reviewed systems are negative.:  Fatigue, sleepiness , snoring, fragmented sleep, Insomnia, allergies congestion of nasal and sinus structures.    How likely are you to doze in the following situations: 0 = not likely, 1 = slight chance, 2 = moderate chance, 3 = high chance   Sitting and Reading? Watching Television? Sitting inactive in a public place (theater or meeting)? As a passenger in a car for an hour without a break? Lying down in the afternoon when circumstances permit? Sitting and talking to someone? Sitting quietly after lunch without alcohol? In a car, while stopped for a few minutes in traffic?   Total = 10/ 24 points   FSS endorsed at 41/ 63 points.   Social History   Socioeconomic History   Marital status: Married    Spouse name: Not on file   Number of children: 2   Years of education: Not on file   Highest education level: [employer] teachers ed degree-  Occupational History   Not on file  disabled Insurance account manager   Tobacco Use   Smoking status: Never   Smokeless tobacco: Never  Vaping Use   Vaping status: Never Used  Substance and Sexual Activity   Alcohol use: No   Drug use: No   Sexual activity: Not on file  Other Topics Concern   Not on file  Social History Narrative   Not on file   Social Determinants of Health   Financial Resource Strain: Not on file  Food Insecurity: Not on file  Transportation Needs: Not on file  Physical Activity: Not on file  Stress: Not on file  Social Connections: Unknown (03/12/2022)   Received from [facility], [facility]   Social Network    Social Network: Not on file    Family History  Problem Relation Age of Onset   Lung cancer Mother    Prostate cancer Father    Hypertension Father    Hyperlipidemia Father    Hypertension Brother    Hyperlipidemia Brother     Colon cancer Neg Hx    Esophageal cancer Neg Hx    Stomach cancer Neg Hx    Rectal cancer Neg Hx     Past Medical History:  Diagnosis Date   Allergy    Anxiety    Chronic kidney disease    Depression    Hypertension     Past Surgical History:  Procedure Laterality Date   NASAL SEPTUM SURGERY  1999     Current Outpatient Medications on File Prior to Visit  Medication Sig Dispense Refill   acetaminophen (TYLENOL) 500 MG tablet Take 2 tablets (1,000 mg total) by mouth every 6 (six) hours as needed. 30 tablet 0   amLODipine (NORVASC) 10 MG tablet TAKE 1 TABLET(10 MG) BY MOUTH DAILY 90 tablet 0   Azelastine HCl 137 MCG/SPRAY SOLN PLACE 1 SPRAY INTO BOTH NOSTRILS 2 (TWO) TIMES DAILY. USE IN EACH NOSTRIL AS DIRECTED 90 mL 0   cetirizine (ZYRTEC) 10 MG tablet Take 10 mg by mouth daily.     EPIPEN 2-PAK 0.3 MG/0.3ML SOAJ injection Inject 0.3 mg into the muscle once.      fluticasone (FLONASE) 50 MCG/ACT nasal spray PLACE 2 SPRAYS INTO BOTH NOSTRILS DAILY. PLACE 2 SPRAYS INTO BOTH NOSTRILS DAILY. 16 mL 5   lamoTRIgine (LAMICTAL) 150 MG tablet Take 300 mg by mouth daily.      meclizine (ANTIVERT) 25 MG tablet Take 1 tablet (25 mg total) by mouth 3 (three) times daily as needed for dizziness. 30 tablet 0   montelukast (SINGULAIR) 10 MG tablet Take 10 mg by mouth at bedtime.      Multiple Vitamin (MULTIVITAMIN) tablet Take 1 tablet by mouth daily.     omeprazole (PRILOSEC) 20 MG capsule TAKE 1 CAPSULE BY MOUTH EVERY DAY 90 capsule 1   ondansetron (ZOFRAN) 4 MG tablet Take 4 mg by mouth 2 (two) times daily as needed.     paliperidone (INVEGA) 6 MG 24 hr tablet Take 6 mg by mouth at bedtime.     QUEtiapine (SEROQUEL) 25 MG tablet Take 12.5-25 mg by mouth at bedtime.     mesalamine (LIALDA) 1.2 g EC tablet Take 2 tablets (2.4 g total) by mouth daily with breakfast. (Patient not taking: Reported on 09/08/2023) 60 tablet 1   No current facility-administered medications on file prior to visit.     Allergies  Allergen Reactions   Shellfish Allergy Anaphylaxis     DIAGNOSTIC DATA (LABS, IMAGING, TESTING) - I reviewed patient records,  labs, notes, testing and imaging myself where available.  Lab Results  Component Value Date   WBC 4.4 11/07/2022   HGB 15.0 11/07/2022   HCT 43.6 11/07/2022   MCV 84.5 11/07/2022   PLT 372 11/07/2022      Component Value Date/Time   NA 140 11/07/2022 1045   K 4.4 11/07/2022 1045   CL 104 11/07/2022 1045   CO2 26 11/07/2022 1045   GLUCOSE 101 (H) 11/07/2022 1045   BUN 15 11/07/2022 1045   CREATININE 1.38 (H) 11/07/2022 1045   CALCIUM 10.3 11/07/2022 1045   PROT 7.4 11/07/2022 1045   ALBUMIN 4.5 08/24/2018 1749   AST 30 11/07/2022 1045   ALT 36 11/07/2022 1045   ALKPHOS 126 08/24/2018 1749   BILITOT 0.4 11/07/2022 1045   GFRNONAA 53 (L) 12/27/2020 0837   GFRAA 61 12/27/2020 0837   Lab Results  Component Value Date   CHOL 159 08/19/2023   HDL 55 08/19/2023   LDLCALC 85 08/19/2023   TRIG 101 08/19/2023   CHOLHDL 2.9 08/19/2023   Lab Results  Component Value Date   HGBA1C 5.5 12/27/2020   Lab Results  Component Value Date   VITAMINB12 647 11/07/2022   Lab Results  Component Value Date   TSH 2.79 11/07/2022    PHYSICAL EXAM:  Today's Vitals   09/08/23 1527  BP: 125/86  Pulse: 83  Weight: 210 lb (95.3 kg)   Body mass index is 32.89 kg/m.   Wt Readings from Last 3 Encounters:  09/08/23 210 lb (95.3 kg)  05/26/23 208 lb (94.3 kg)  02/04/23 208 lb 2 oz (94.4 kg)     Ht Readings from Last 3 Encounters:  05/26/23 5\' 7"  (1.702 m)  02/04/23 5\' 7"  (1.702 m)  01/07/23 5\' 7"  (1.702 m)      General: The patient is awake, alert and appears not in acute distress. The patient is well groomed. Head: Normocephalic, atraumatic. Neck is supple.   Mallampati 3 plus ,  neck circumference:16 inches . Nasal airflow fully patent.  Retrognathia is not  seen.  Facial hair is present. Dental status: biological   Cardiovascular:  Regular rate and cardiac rhythm by pulse,  without distended neck veins. Respiratory: Lungs are clear to auscultation.  Skin:  Without evidence of ankle edema, or rash. Trunk: The patient's posture is erect.   NEUROLOGIC EXAM: The patient is awake and alert, oriented to place and time.   Memory subjective described as intact.  Attention span & concentration ability appears normal.  Speech is fluent,  without  dysarthria, dysphonia or aphasia.  Mood and affect are appropriate.   Cranial nerves: no loss of smell or taste reported  Pupils are equal and briskly reactive to light. Funduscopic exam deferred.  Extraocular movements in vertical and horizontal planes were intact and without nystagmus. No Diplopia. Visual fields by finger perimetry are intact. Hearing was intact to soft voice and finger rubbing.    Facial sensation intact to fine touch.  Facial motor strength is symmetric and tongue and uvula move midline.  Neck ROM : rotation, tilt and flexion extension were normal for age and shoulder shrug was symmetrical.    Motor exam:  Symmetric bulk, tone and ROM.   Normal tone without cog -wheeling, symmetric grip strength .   Sensory:  Fine touch, pinprick and vibration were tested  and  normal. Proprioception tested in the upper extremities was normal.   Coordination: Rapid alternating movements in the fingers/hands were of normal speed.  The left Finger-to-nose maneuver was intact without evidence of ataxia, dysmetria or tremor. The right was associated with ataxia.  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    Gait and station: Patient could rise unassisted from a seated position, walked without assistive device.  Stance is of normal width/ base . Toe and heel walk were deferred.  Deep tendon reflexes: in the upper and lower extremities are symmetric and intact.  Babinski response was deferred.    ASSESSMENT AND PLAN 52 y.o. year old male patient with  hypersomnia , prolonged sleep time, on medications - here with:    1)Chronic Insomnia treated with Seroquel, helping panic and anxiety attacks, and insomnia.  This converted the patient to a hyper-somnic individual, who is now sleeping 10-12 hors a day , prolonged sleep time and still fatigued- may be groggy?   2) There are risk factors for OSA, these are high grade Mallampati , a BMI over 32 and  abdominal obesity. Snoring when nasal allergies are acting up.   HST ordered. This patient will have an easier time sleeping at home an was given sleep quality measures. His sleep inertia in AM would not make him a good in -lab candidate.     I plan to follow up  PRN either personally or through our NP within 3-4 months if the HST is positive .   I would like to thank Donita Brooks, MD or allowing me to meet with and to take care of this pleasant patient.    After spending a total time of 35 minutes face to face and additional time for physical and neurologic examination, review of laboratory studies,  personal review of imaging studies, reports and results of other testing and review of referral information / records as far as provided in visit,   Electronically signed by: Melvyn Novas, MD 09/08/2023 3:42 PM  [facility] Board certified by The [employer] of Sleep Medicine and Diplomate of the [employer] of Sleep Medicine. Board certified In Neurology through the ABPN, Fellow of the [employer] of Neurology.

## 2023-09-24 ENCOUNTER — Other Ambulatory Visit: Payer: Self-pay | Admitting: Family Medicine

## 2023-09-24 DIAGNOSIS — J320 Chronic maxillary sinusitis: Secondary | ICD-10-CM

## 2023-09-29 NOTE — Telephone Encounter (Signed)
Requested Prescriptions  Pending Prescriptions Disp Refills   fluticasone (FLONASE) 50 MCG/ACT nasal spray [Pharmacy Med Name: FLUTICASONE NASAL SP (120) RX] 16 g     Sig: INHALE 2 SPRAYS IN BOTH NOSTILS EVERY DAY.     Ear, Nose, and Throat: Nasal Preparations - Corticosteroids Failed - 09/24/2023  6:48 PM      Failed - Valid encounter within last 12 months    Recent Outpatient Visits           2 years ago Atypical chest pain   [facility] Donita Brooks, MD   2 years ago Stage 3 chronic kidney disease, unspecified whether stage 3a or 3b CKD (HCC)   [facility] Donita Brooks, MD   3 years ago Left wrist pain   [facility] Lawson Fiscal A, FNP   3 years ago Stage 3 chronic kidney disease, unspecified whether stage 3a or 3b CKD   [facility] Pickard, Priscille Heidelberg, MD   4 years ago General medical exam   [facility] Pickard, Priscille Heidelberg, MD

## 2023-10-03 ENCOUNTER — Other Ambulatory Visit: Payer: Self-pay | Admitting: Family Medicine

## 2023-10-03 DIAGNOSIS — R140 Abdominal distension (gaseous): Secondary | ICD-10-CM

## 2023-10-03 DIAGNOSIS — R110 Nausea: Secondary | ICD-10-CM

## 2023-10-06 ENCOUNTER — Telehealth: Payer: Self-pay

## 2023-10-06 ENCOUNTER — Other Ambulatory Visit: Payer: Self-pay

## 2023-10-06 DIAGNOSIS — R140 Abdominal distension (gaseous): Secondary | ICD-10-CM

## 2023-10-06 DIAGNOSIS — R110 Nausea: Secondary | ICD-10-CM

## 2023-10-06 MED ORDER — OMEPRAZOLE 20 MG PO CPDR: DELAYED_RELEASE_CAPSULE | ORAL | 1 refills | Status: DC

## 2023-10-06 NOTE — Telephone Encounter (Signed)
Prescription Request  10/06/2023  LOV: 05/26/23  What is the name of the medication or equipment? omeprazole (PRILOSEC) 20 MG capsule [[id number]]   Have you contacted your pharmacy to request a refill? Yes   Which pharmacy would you like this sent to?  [facility] [id number] - [place], [place] [phone] AT [facility] [address] [place] [address] Phone: [phone] Fax: [phone]    Patient notified that their request is being sent to the clinical staff for review and that they should receive a response within 2 business days.   Please advise at [facility] [phone]

## 2023-10-06 NOTE — Telephone Encounter (Signed)
Requested Prescriptions  Refused Prescriptions Disp Refills   omeprazole (PRILOSEC) 20 MG capsule [Pharmacy Med Name: OMEPRAZOLE 20MG  CAPSULES] 90 capsule 1    Sig: TAKE 1 CAPSULE BY MOUTH EVERY DAY     Gastroenterology: Proton Pump Inhibitors Failed - 10/03/2023 12:23 PM      Failed - Valid encounter within last 12 months    Recent Outpatient Visits           2 years ago Atypical chest pain   [facility] Donita Brooks, MD   2 years ago Stage 3 chronic kidney disease, unspecified whether stage 3a or 3b CKD (HCC)   [facility] Pickard, Priscille Heidelberg, MD   3 years ago Left wrist pain   [facility] Jenne Pane, Crystal A, FNP   3 years ago Stage 3 chronic kidney disease, unspecified whether stage 3a or 3b CKD   [facility] Pickard, Priscille Heidelberg, MD   4 years ago General medical exam   [facility] Pickard, Priscille Heidelberg, MD

## 2023-10-10 ENCOUNTER — Ambulatory Visit: Payer: Self-pay | Admitting: Family Medicine

## 2023-10-10 NOTE — Telephone Encounter (Signed)
Copied from CRM [id number]. Topic: Clinical - Red Word Triage >> Oct 10, 2023 11:56 AM Gaetano Hawthorne wrote: Red Word that prompted transfer to Nurse Triage: Patient sneezed and put out his neck - he's experiencing muscle pain in his neck.  Chief Complaint: neck pain Symptoms: unable to turn head without pain Frequency: ongoing since this morning Pertinent Negatives: Patient denies weakness or tingling  Disposition: [] ED /[] Urgent Care (no appt availability in office) / [] Appointment(In office/virtual)/ []  [facility]/ [x] Home Care/ [] Refused Recommended Disposition /[] [facility]/ []  Follow-up with PCP Additional Notes: The patient's wife is requesting a muscle relaxant for neck pain caused by a sneeze.  She reported that the patient has bipolar disorder and is constantly tense.  When he sneezed he pulled a muscle in his neck causing discomfort.  She noted that Riley Nearing has happened in the past and he was previously referred to urgent care for muscle relaxants.  He had some on hand from the previous injury and is taking that for the discomfort but he is almost out of medication.  The patient's wife requested a refill of the pain medication.  Offered to schedule the patient with a different provider for follow up as pcp was unavailable until 10/24/23 however she opted for that appointment and stated that his medication should last him through "this episode."  Routed to hcp to advise.     Reason for Disposition  Neck pain or stiffness from lifting, bending or twisting injury  Answer Assessment - Initial Assessment Questions 1. MECHANISM: "How did the injury happen?" (e.g., fall, MVA, twisting injury; consider the possibility of domestic violence or elder abuse)     Muscles are tense and he sneezed and pulled his neck  2. ONSET: "When did the injury happen?" (e.g., minutes, hours, days)     Today  3. LOCATION: "What part of the neck is injured?" "Where does it hurt?"     Both sides  4.  PAIN SEVERITY: "How bad is the pain?" "Can you move the neck normally?" (Scale 1-10; or mild, moderate, severe)   - NO PAIN (0): no pain, or only slight stiffness    - MILD (1-3): doesn't interfere with normal activities    - MODERATE (4-7): interferes with normal activities or awakens from sleep    - SEVERE (8-10):  excruciating pain, unable to do any normal activities       Moderate - hard to turn head; hot shower and ice  5. CORD SYMPTOMS: "Any weakness or numbness of the arms or legs?"     none 6. SIZE: For cuts, bruises, or swelling, ask: "How large is it?" (e.g., inches or centimeters)      Swollen  7. TETANUS: For any breaks in the skin, ask: "When was the last tetanus booster?"     N/a 8. OTHER SYMPTOMS: "Do you have any other symptoms?" (e.g., headache)     none  Protocols used: Neck Injury-A-AH

## 2023-10-10 NOTE — Telephone Encounter (Signed)
Called and spoke w/pt's wife, Winfield Rast, reconfirm w/wife about his neck pain. However, that pcp doesn't have any available time until the 10/24/23 which she did make an appt for pt. Wife voiced understanding. I also mentioned to wife that should pt's pain worsens to take him to UC, wife agrees.

## 2023-10-14 DIAGNOSIS — F250 Schizoaffective disorder, bipolar type: Secondary | ICD-10-CM | POA: Diagnosis not present

## 2023-10-14 DIAGNOSIS — F5105 Insomnia due to other mental disorder: Secondary | ICD-10-CM | POA: Diagnosis not present

## 2023-10-18 ENCOUNTER — Other Ambulatory Visit: Payer: Self-pay

## 2023-10-18 ENCOUNTER — Emergency Department (HOSPITAL_COMMUNITY)
Admission: EM | Admit: 2023-10-18 | Discharge: 2023-10-19 | Disposition: A | Discharge status: Home / Self Care | Payer: Managed Care, Other (non HMO) | Attending: Emergency Medicine | Admitting: Emergency Medicine

## 2023-10-18 DIAGNOSIS — R140 Abdominal distension (gaseous): Secondary | ICD-10-CM | POA: Diagnosis not present

## 2023-10-18 DIAGNOSIS — R109 Unspecified abdominal pain: Secondary | ICD-10-CM

## 2023-10-18 DIAGNOSIS — K921 Melena: Secondary | ICD-10-CM | POA: Insufficient documentation

## 2023-10-18 DIAGNOSIS — N181 Chronic kidney disease, stage 1: Secondary | ICD-10-CM | POA: Insufficient documentation

## 2023-10-18 DIAGNOSIS — K760 Fatty (change of) liver, not elsewhere classified: Secondary | ICD-10-CM | POA: Diagnosis not present

## 2023-10-18 DIAGNOSIS — R1011 Right upper quadrant pain: Secondary | ICD-10-CM | POA: Diagnosis not present

## 2023-10-18 DIAGNOSIS — Z79899 Other long term (current) drug therapy: Secondary | ICD-10-CM | POA: Diagnosis not present

## 2023-10-18 DIAGNOSIS — I129 Hypertensive chronic kidney disease with stage 1 through stage 4 chronic kidney disease, or unspecified chronic kidney disease: Secondary | ICD-10-CM | POA: Diagnosis not present

## 2023-10-18 DIAGNOSIS — R112 Nausea with vomiting, unspecified: Secondary | ICD-10-CM | POA: Diagnosis not present

## 2023-10-18 LAB — CBC
HCT: 44.2 % (ref 39.0–52.0)
Hemoglobin: 14.7 g/dL (ref 13.0–17.0)
MCH: 29.4 pg (ref 26.0–34.0)
MCHC: 33.3 g/dL (ref 30.0–36.0)
MCV: 88.4 fL (ref 80.0–100.0)
Platelets: 386 10*3/uL (ref 150–400)
RBC: 5 MIL/uL (ref 4.22–5.81)
RDW: 13 % (ref 11.5–15.5)
WBC: 4.5 10*3/uL (ref 4.0–10.5)
nRBC: 0 % (ref 0.0–0.2)

## 2023-10-18 LAB — COMPREHENSIVE METABOLIC PANEL
ALT: 80 U/L — ABNORMAL HIGH (ref 0–44)
AST: 61 U/L — ABNORMAL HIGH (ref 15–41)
Albumin: 4.5 g/dL (ref 3.5–5.0)
Alkaline Phosphatase: 204 U/L — ABNORMAL HIGH (ref 38–126)
Anion gap: 9 (ref 5–15)
BUN: 12 mg/dL (ref 6–20)
CO2: 24 mmol/L (ref 22–32)
Calcium: 10.1 mg/dL (ref 8.9–10.3)
Chloride: 103 mmol/L (ref 98–111)
Creatinine, Ser: 1.32 mg/dL — ABNORMAL HIGH (ref 0.61–1.24)
GFR, Estimated: >60 mL/min (ref >60)
Glucose, Bld: 108 mg/dL — ABNORMAL HIGH (ref 70–99)
Potassium: 4.3 mmol/L (ref 3.5–5.1)
Sodium: 136 mmol/L (ref 135–145)
Total Bilirubin: 0.6 mg/dL (ref <1.2)
Total Protein: 8.2 g/dL — ABNORMAL HIGH (ref 6.5–8.1)

## 2023-10-18 LAB — TYPE AND SCREEN
ABO/RH(D): O POS
Antibody Screen: NEGATIVE

## 2023-10-18 NOTE — ED Triage Notes (Addendum)
Patient reports n/v x 3 days Last night had bright red blood in stool Denies pain, says stomach feels like it's twisted inside  Says he has not had any energy  Patient says blood was mixed in with stool, no active bleeding or copious amounts of blood in toilet Says it has not happened today Recently took medication for colitis Had 3 bowel movements yesterday, usually only has 1-2 per day

## 2023-10-19 ENCOUNTER — Emergency Department (HOSPITAL_COMMUNITY): Payer: Managed Care, Other (non HMO)

## 2023-10-19 DIAGNOSIS — K760 Fatty (change of) liver, not elsewhere classified: Secondary | ICD-10-CM | POA: Diagnosis not present

## 2023-10-19 DIAGNOSIS — R109 Unspecified abdominal pain: Secondary | ICD-10-CM | POA: Diagnosis not present

## 2023-10-19 DIAGNOSIS — K921 Melena: Secondary | ICD-10-CM | POA: Diagnosis not present

## 2023-10-19 DIAGNOSIS — R1011 Right upper quadrant pain: Secondary | ICD-10-CM | POA: Diagnosis not present

## 2023-10-19 DIAGNOSIS — R112 Nausea with vomiting, unspecified: Secondary | ICD-10-CM | POA: Diagnosis not present

## 2023-10-19 LAB — LIPASE, BLOOD: Lipase: 26 U/L (ref 11–51)

## 2023-10-19 MED ORDER — ALUM & MAG HYDROXIDE-SIMETH 200-200-20 MG/5ML PO SUSP
30.0000 mL | Freq: Once | ORAL | Status: AC
  Administered 2023-10-19: 30 mL via ORAL
  Filled 2023-10-19: qty 30

## 2023-10-19 MED ORDER — HYOSCYAMINE SULFATE 0.125 MG PO TABS: 0.1250 mg | ORAL_TABLET | Freq: Once | ORAL | Status: DC

## 2023-10-19 MED ORDER — HYOSCYAMINE SULFATE 0.125 MG SL SUBL: 0.1250 mg | SUBLINGUAL_TABLET | Freq: Four times a day (QID) | SUBLINGUAL | 0 refills | Status: AC | PRN

## 2023-10-19 MED ORDER — ONDANSETRON HCL 4 MG/2ML IJ SOLN
4.0000 mg | Freq: Once | INTRAMUSCULAR | Status: AC
  Administered 2023-10-19: 4 mg via INTRAVENOUS
  Filled 2023-10-19: qty 2

## 2023-10-19 MED ORDER — HYOSCYAMINE SULFATE 0.125 MG SL SUBL
0.1250 mg | SUBLINGUAL_TABLET | Freq: Once | SUBLINGUAL | Status: AC
  Administered 2023-10-19: 0.125 mg via ORAL
  Filled 2023-10-19: qty 1

## 2023-10-19 MED ORDER — ONDANSETRON 4 MG PO TBDP: 4.0000 mg | ORAL_TABLET | Freq: Three times a day (TID) | ORAL | 0 refills | Status: AC | PRN

## 2023-10-19 MED ORDER — POLYETHYLENE GLYCOL 3350 17 GM/SCOOP PO POWD: 17.0000 g | Freq: Every day | ORAL | 0 refills | Status: AC

## 2023-10-19 NOTE — ED Provider Notes (Signed)
[place] EMERGENCY DEPARTMENT AT [facility] Provider Note  CSN: [id number] Arrival date & time: 10/18/23 1136  Chief Complaint(s) Blood In Stools  HPI Dustin Solis is a 52 y.o. male here for several bouts of hematochezia yesterday.  Reports the last bout was approximately 24 hours ago.  He is endorsing nausea without emesis.  Endorses intermittent generalized abdominal discomfort..  No focal tenderness.  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  Patient also has a history of diverticulosis.   The history is provided by the patient.    Past Medical History Past Medical History:  Diagnosis Date   Allergy    Anxiety    Chronic kidney disease    Depression    Hypertension    Patient Active Problem List   Diagnosis Date Noted   Chronic kidney disease    Sinusitis, chronic 01/03/2016   Chronic allergic rhinitis 01/03/2016   CKD (chronic kidney disease), stage I    Cervical strain, acute 02/22/2015   Home Medication(s) Prior to Admission medications   Medication Sig Start Date End Date Taking? Authorizing Provider  hyoscyamine (LEVSIN/SL) 0.125 MG SL tablet Place 1 tablet (0.125 mg total) under the tongue every 6 (six) hours as needed for up to 5 days. 10/19/23 10/24/23 Yes Trinidy Masterson, Amadeo Garnet, MD  ondansetron (ZOFRAN-ODT) 4 MG disintegrating tablet Take 1 tablet (4 mg total) by mouth every 8 (eight) hours as needed for up to 3 days for nausea or vomiting. 10/19/23 10/22/23 Yes Daphene Chisholm, Amadeo Garnet, MD  polyethylene glycol powder (MIRALAX) 17 GM/SCOOP powder Take 17 g by mouth daily. 10/19/23  Yes Catrice Zuleta, Amadeo Garnet, MD  acetaminophen (TYLENOL) 500 MG tablet Take 2 tablets (1,000 mg total) by mouth every 6 (six) hours as needed. 08/24/18   Arby Barrette, MD  amLODipine (NORVASC) 10 MG tablet TAKE 1 TABLET(10 MG) BY MOUTH DAILY 08/13/23   Donita Brooks, MD  Azelastine HCl 137  MCG/SPRAY SOLN PLACE 1 SPRAY INTO BOTH NOSTRILS 2 (TWO) TIMES DAILY. USE IN EACH NOSTRIL AS DIRECTED 04/17/22   Donita Brooks, MD  cetirizine (ZYRTEC) 10 MG tablet Take 10 mg by mouth daily.    [provider]  EPIPEN 2-PAK 0.3 MG/0.3ML SOAJ injection Inject 0.3 mg into the muscle once.  01/22/16   [provider]  fluticasone (FLONASE) 50 MCG/ACT nasal spray INHALE 2 SPRAYS IN BOTH NOSTILS EVERY DAY. 09/29/23   Donita Brooks, MD  lamoTRIgine (LAMICTAL) 150 MG tablet Take 300 mg by mouth daily.  02/23/17   [provider]  meclizine (ANTIVERT) 25 MG tablet Take 1 tablet (25 mg total) by mouth 3 (three) times daily as needed for dizziness. 05/26/23   Donita Brooks, MD  mesalamine (LIALDA) 1.2 g EC tablet Take 2 tablets (2.4 g total) by mouth daily with breakfast. Patient not taking: Reported on 09/08/2023 02/04/23   Sherrilyn Rist, MD  montelukast (SINGULAIR) 10 MG tablet Take 10 mg by mouth at bedtime.  01/22/16   [provider]  Multiple Vitamin (MULTIVITAMIN) tablet Take 1 tablet by mouth daily.    [provider]  omeprazole (PRILOSEC) 20 MG capsule TAKE 1 CAPSULE BY MOUTH EVERY DAY 10/06/23   Donita Brooks, MD  ondansetron (ZOFRAN) 4 MG tablet Take 4 mg by mouth 2 (two) times daily as needed. 09/25/21   [provider]  paliperidone (INVEGA) 6 MG 24 hr tablet  Take 6 mg by mouth at bedtime. 12/26/20   [provider]  QUEtiapine (SEROQUEL) 25 MG tablet Take 12.5-25 mg by mouth at bedtime. 12/02/20   [provider]                                                                                                                                    Allergies Shellfish allergy  Review of Systems Review of Systems As noted in HPI  Physical Exam Vital Signs  I have reviewed the triage vital signs BP 118/82   Pulse 79   Temp 98.4 F (36.9 C) (Oral)   Resp 14   SpO2 100%   Physical Exam Vitals reviewed.   Constitutional:      General: He is not in acute distress.    Appearance: He is well-developed. He is not diaphoretic.  HENT:     Head: Normocephalic and atraumatic.     Right Ear: External ear normal.     Left Ear: External ear normal.     Nose: Nose normal.     Mouth/Throat:     Mouth: Mucous membranes are moist.  Eyes:     General: No scleral icterus.    Conjunctiva/sclera: Conjunctivae normal.  Neck:     Trachea: Phonation normal.  Cardiovascular:     Rate and Rhythm: Normal rate and regular rhythm.  Pulmonary:     Effort: Pulmonary effort is normal. No respiratory distress.     Breath sounds: No stridor.  Abdominal:     General: There is no distension.     Tenderness: There is abdominal tenderness (mild discomfort) in the right upper quadrant, right lower quadrant, epigastric area and periumbilical area. There is no guarding or rebound. Negative signs include Murphy's sign and McBurney's sign.  Musculoskeletal:        General: Normal range of motion.     Cervical back: Normal range of motion.  Neurological:     Mental Status: He is alert and oriented to person, place, and time.  Psychiatric:        Behavior: Behavior normal.     ED Results and Treatments Labs (all labs ordered are listed, but only abnormal results are displayed) Labs Reviewed  COMPREHENSIVE METABOLIC PANEL - Abnormal; Notable for the following components:      Result Value   Glucose, Bld 108 (*)    Creatinine, Ser 1.32 (*)    Total Protein 8.2 (*)    AST 61 (*)    ALT 80 (*)    Alkaline Phosphatase 204 (*)    All other components within normal limits  CBC  LIPASE, BLOOD  POC OCCULT BLOOD, ED  TYPE AND SCREEN  EKG  EKG Interpretation Date/Time:  Saturday October 18 2023 23:47:56 EST Ventricular Rate:  72 PR Interval:    QRS Duration:  112 QT Interval:  376 QTC  Calculation: 412 R Axis:   -13  Text Interpretation: Sinus rhythm Borderline intraventricular conduction delay Low voltage, precordial leads No old tracing to compare Confirmed by Drema Pry [phone]) on 10/19/2023 12:39:52 AM       Radiology DG ABD ACUTE 2+V W 1V CHEST Result Date: 10/19/2023 CLINICAL DATA:  Nausea and vomiting.  Abdominal pain for 1 day. EXAM: DG ABDOMEN ACUTE WITH 1 VIEW CHEST COMPARISON:  CT chest 08/24/18.  Abdominal radiographs 08/04/2018 FINDINGS: Normal heart size and pulmonary vascularity. No focal airspace disease or consolidation in the lungs. No blunting of costophrenic angles. No pneumothorax. Mediastinal contours appear intact. Scattered gas and stool in the colon. No small or large bowel distention. No free intra-abdominal air. No abnormal air-fluid levels. No radiopaque stones. Degenerative changes in the lumbar spine and hips. Soft tissue contours appear intact. IMPRESSION: 1. No evidence of active pulmonary disease. 2. Normal nonobstructive bowel gas pattern. Electronically Signed   By: Burman Nieves M.D.   On: 10/19/2023 00:59   US Abdomen Limited RUQ (LIVER/GB) Result Date: 10/19/2023 CLINICAL DATA:  Right upper quadrant pain EXAM: ULTRASOUND ABDOMEN LIMITED RIGHT UPPER QUADRANT COMPARISON:  None Available. FINDINGS: Gallbladder: No gallstones or wall thickening visualized. No sonographic Murphy sign noted by sonographer. Common bile duct: Diameter: Normal caliber, 2 mm Liver: Increased echotexture compatible with fatty infiltration. No focal abnormality or biliary ductal dilatation. Focal fatty sparing adjacent to the gallbladder fossa. Portal vein is patent on color Doppler imaging with normal direction of blood flow towards the liver. Other: None. IMPRESSION: Fatty infiltration of the liver. Electronically Signed   By: Charlett Nose M.D.   On: 10/19/2023 00:35    Medications Ordered in ED Medications  ondansetron (ZOFRAN) injection 4 mg (4 mg  Intravenous Given 10/19/23 0114)  alum & mag hydroxide-simeth (MAALOX/MYLANTA) 200-200-20 MG/5ML suspension 30 mL (30 mLs Oral Given 10/19/23 0114)  hyoscyamine (LEVSIN SL) SL tablet 0.125 mg (0.125 mg Oral Given 10/19/23 0126)   Procedures Procedures  (including critical care time) Medical Decision Making / ED Course   Medical Decision Making Amount and/or Complexity of Data Reviewed Labs: ordered. Radiology: ordered.  Risk OTC drugs. Prescription drug management.     Complexity of Problem:  Co-morbidities/SDOH that complicate the patient evaluation/care: Noted above in HPI    Patient's presenting problem/concern, DDX, and MDM listed below: Abdominal cramping and hematochezia No active bleeding in over 24 hours.  Doubt severe colitis, active bleeds.  Possible diverticular or hemorrhoidal bleed now resolved.  Patient does have some right upper quadrant and right sided abdominal discomfort without evidence of peritonitis.  Will assess for biliary disease.  He reports no bowel movements for the past 24 hours thus we will obtain an acute abdominal series to assess for obstructing bowel pattern no SBO concern is low at this time.  Hospitalization Considered:  Yes  Initial Intervention:  Antiemetic    Complexity of Data:     Laboratory Tests ordered listed below with my independent interpretation: CBC without leukocytosis or anemia CMP without significant electrolyte derangements.  Mild renal sufficiency with stable renal function.  Mildly elevated LFTs and alk phos without overt biliary obstruction or pancreatitis.   Imaging Studies ordered listed below with my independent interpretation: Right upper quadrant ultrasound obtained given the mildly elevated LFTs notable for fatty liver disease.  No biliary obstruction or acute biliary disease. Acute abdominal series without obstructing gas pattern.  Patient does have stool mostly in the ascending colon.     ED Course:     Assessment, Add'l Intervention, and Reassessment: Patient given Levsin and then Maalox which did provide some additional relief.  Able to tolerate p.o.  Concern for other serious intra-abdominal inflammatory/infectious process requiring advanced imaging at this time is low.     Final Clinical Impression(s) / ED Diagnoses Final diagnoses:  Hematochezia  Abdominal bloating with cramps   The patient appears reasonably screened and/or stabilized for discharge and I doubt any other medical condition or other [facility] requiring further screening, evaluation, or treatment in the ED at this time. I have discussed the findings, Dx and Tx plan with the patient/family who expressed understanding and agree(s) with the plan. Discharge instructions discussed at length. The patient/family was given strict return precautions who verbalized understanding of the instructions. No further questions at time of discharge.  Disposition: Discharge  Condition: Good  ED Discharge Orders          Ordered    hyoscyamine (LEVSIN/SL) 0.125 MG SL tablet  Every 6 hours PRN        10/19/23 0220    polyethylene glycol powder (MIRALAX) 17 GM/SCOOP powder  Daily        10/19/23 0220    ondansetron (ZOFRAN-ODT) 4 MG disintegrating tablet  Every 8 hours PRN        10/19/23 0220             Follow Up: Donita Brooks, MD [address] [place] [address] [phone]  Call  to schedule an appointment for close follow up  Danis, Andreas Blower, MD [address] [place] [address] [place] [address] [phone]  Call  to schedule an appointment for close follow up    This chart was dictated using voice recognition software.  Despite best efforts to proofread,  errors can occur which can change the documentation meaning.    Nira Conn, MD 10/19/23 [phone]

## 2023-10-19 NOTE — ED Notes (Signed)
PT able to take PO without difficulty

## 2023-10-19 NOTE — ED Provider Notes (Incomplete)
[place] EMERGENCY DEPARTMENT AT [facility] Provider Note  CSN: [id number] Arrival date & time: 10/18/23 1136  Chief Complaint(s) Blood In Stools  HPI Dustin Solis is a 52 y.o. male {Add pertinent medical, surgical, social history, OB history to HPI:1}   HPI  Past Medical History Past Medical History:  Diagnosis Date  . Allergy   . Anxiety   . Chronic kidney disease   . Depression   . Hypertension    Patient Active Problem List   Diagnosis Date Noted  . Chronic kidney disease   . Sinusitis, chronic 01/03/2016  . Chronic allergic rhinitis 01/03/2016  . CKD (chronic kidney disease), stage I   . Cervical strain, acute 02/22/2015   Home Medication(s) Prior to Admission medications   Medication Sig Start Date End Date Taking? Authorizing Provider  acetaminophen (TYLENOL) 500 MG tablet Take 2 tablets (1,000 mg total) by mouth every 6 (six) hours as needed. 08/24/18   Arby Barrette, MD  amLODipine (NORVASC) 10 MG tablet TAKE 1 TABLET(10 MG) BY MOUTH DAILY 08/13/23   Donita Brooks, MD  Azelastine HCl 137 MCG/SPRAY SOLN PLACE 1 SPRAY INTO BOTH NOSTRILS 2 (TWO) TIMES DAILY. USE IN EACH NOSTRIL AS DIRECTED 04/17/22   Donita Brooks, MD  cetirizine (ZYRTEC) 10 MG tablet Take 10 mg by mouth daily.    [provider]  EPIPEN 2-PAK 0.3 MG/0.3ML SOAJ injection Inject 0.3 mg into the muscle once.  01/22/16   [provider]  fluticasone (FLONASE) 50 MCG/ACT nasal spray INHALE 2 SPRAYS IN BOTH NOSTILS EVERY DAY. 09/29/23   Donita Brooks, MD  lamoTRIgine (LAMICTAL) 150 MG tablet Take 300 mg by mouth daily.  02/23/17   [provider]  meclizine (ANTIVERT) 25 MG tablet Take 1 tablet (25 mg total) by mouth 3 (three) times daily as needed for dizziness. 05/26/23   Donita Brooks, MD  mesalamine (LIALDA) 1.2 g EC tablet Take 2 tablets (2.4 g total) by mouth daily with breakfast. Patient not taking: Reported on 09/08/2023 02/04/23   Sherrilyn Rist, MD  montelukast (SINGULAIR) 10 MG tablet Take 10 mg by mouth at bedtime.  01/22/16   [provider]  Multiple Vitamin (MULTIVITAMIN) tablet Take 1 tablet by mouth daily.    [provider]  omeprazole (PRILOSEC) 20 MG capsule TAKE 1 CAPSULE BY MOUTH EVERY DAY 10/06/23   Donita Brooks, MD  ondansetron (ZOFRAN) 4 MG tablet Take 4 mg by mouth 2 (two) times daily as needed. 09/25/21   [provider]  paliperidone (INVEGA) 6 MG 24 hr tablet Take 6 mg by mouth at bedtime. 12/26/20   [provider]  QUEtiapine (SEROQUEL) 25 MG tablet Take 12.5-25 mg by mouth at bedtime. 12/02/20   [provider]  Allergies Shellfish allergy  Review of Systems Review of Systems As noted in HPI  Physical Exam Vital Signs  I have reviewed the triage vital signs BP (!) 111/54 (BP Location: Left Arm)   Pulse 82   Temp 97.6 F (36.4 C) (Oral)   Resp 18   SpO2 91%  *** Physical Exam  ED Results and Treatments Labs (all labs ordered are listed, but only abnormal results are displayed) Labs Reviewed  COMPREHENSIVE METABOLIC PANEL - Abnormal; Notable for the following components:      Result Value   Glucose, Bld 108 (*)    Creatinine, Ser 1.32 (*)    Total Protein 8.2 (*)    AST 61 (*)    ALT 80 (*)    Alkaline Phosphatase 204 (*)    All other components within normal limits  CBC  LIPASE, BLOOD  POC OCCULT BLOOD, ED  TYPE AND SCREEN                                                                                                                         EKG  EKG Interpretation Date/Time:    Ventricular Rate:    PR Interval:    QRS Duration:    QT Interval:    QTC Calculation:   R Axis:      Text Interpretation:         Radiology No results found.  Medications Ordered in ED Medications - No data to  display Procedures Procedures  (including critical care time) Medical Decision Making / ED Course   Medical Decision Making Amount and/or Complexity of Data Reviewed Labs: ordered.    ***    Final Clinical Impression(s) / ED Diagnoses Final diagnoses:  None    This chart was dictated using voice recognition software.  Despite best efforts to proofread,  errors can occur which can change the documentation meaning.

## 2023-10-21 ENCOUNTER — Telehealth: Payer: Self-pay | Admitting: Neurology

## 2023-10-21 NOTE — Telephone Encounter (Signed)
HST [employer] no auth req   [employer].

## 2023-10-24 ENCOUNTER — Encounter: Payer: Self-pay | Admitting: Family Medicine

## 2023-10-24 ENCOUNTER — Ambulatory Visit (INDEPENDENT_AMBULATORY_CARE_PROVIDER_SITE_OTHER): Payer: Managed Care, Other (non HMO) | Admitting: Family Medicine

## 2023-10-24 VITALS — BP 126/86 | HR 96 | Temp 98.3°F | Ht 67.0 in | Wt 205.5 lb

## 2023-10-24 DIAGNOSIS — R7989 Other specified abnormal findings of blood chemistry: Secondary | ICD-10-CM

## 2023-10-24 MED ORDER — ONDANSETRON HCL 4 MG PO TABS: 4.0000 mg | ORAL_TABLET | Freq: Three times a day (TID) | ORAL | 0 refills | Status: DC | PRN

## 2023-10-24 MED ORDER — CYCLOBENZAPRINE HCL 10 MG PO TABS: 10.0000 mg | ORAL_TABLET | Freq: Three times a day (TID) | ORAL | 2 refills | Status: AC | PRN

## 2023-10-24 NOTE — Progress Notes (Signed)
Subjective:    Patient ID: Dustin Solis, male    DOB: September 19, 1971, 52 y.o.   MRN: [id number] Patient has been to the hospital twice.  First, he experienced neck pain after sneezing hard.  He was given prednisone and a muscle relaxer.  The neck pain on the right side has improved.  He continues to have muscle spasms and left-sided neck.  They have ongoing muscle laxer.  He denies any numbness or tingling or burning pain radiating down his arm.  He denies any bone tenderness in his neck.  The pain is primarily in the trapezius and the cervical paraspinal muscles.  It hurts to turn his head to the left side.  The second time he went to the hospital was for blood in his stool.  He had an ultrasound at the hospital which showed fatty liver disease.  His liver function test were elevated.  He was diagnosed with diverticulosis.  The bleeding has stopped.  He had a colonoscopy earlier this year that showed mild colitis as well as diverticulosis.  At the present time he is asymptomatic  Past Medical History:  Diagnosis Date   Allergy    Anxiety    Chronic kidney disease    Depression    Hypertension    Past Surgical History:  Procedure Laterality Date   NASAL SEPTUM SURGERY  1999   Current Outpatient Medications on File Prior to Visit  Medication Sig Dispense Refill   acetaminophen (TYLENOL) 500 MG tablet Take 2 tablets (1,000 mg total) by mouth every 6 (six) hours as needed. 30 tablet 0   amLODipine (NORVASC) 10 MG tablet TAKE 1 TABLET(10 MG) BY MOUTH DAILY 90 tablet 0   Azelastine HCl 137 MCG/SPRAY SOLN PLACE 1 SPRAY INTO BOTH NOSTRILS 2 (TWO) TIMES DAILY. USE IN EACH NOSTRIL AS DIRECTED 90 mL 0   cetirizine (ZYRTEC) 10 MG tablet Take 10 mg by mouth daily.     EPIPEN 2-PAK 0.3 MG/0.3ML SOAJ injection Inject 0.3 mg into the muscle once.      fluticasone (FLONASE) 50 MCG/ACT nasal spray INHALE 2 SPRAYS IN BOTH NOSTILS EVERY DAY. 16 mL 5   hyoscyamine (LEVSIN/SL) 0.125 MG SL tablet Place 1 tablet  (0.125 mg total) under the tongue every 6 (six) hours as needed for up to 5 days. 30 tablet 0   lamoTRIgine (LAMICTAL) 150 MG tablet Take 300 mg by mouth daily.      meclizine (ANTIVERT) 25 MG tablet Take 1 tablet (25 mg total) by mouth 3 (three) times daily as needed for dizziness. 30 tablet 0   mesalamine (LIALDA) 1.2 g EC tablet Take 2 tablets (2.4 g total) by mouth daily with breakfast. (Patient not taking: Reported on 09/08/2023) 60 tablet 1   montelukast (SINGULAIR) 10 MG tablet Take 10 mg by mouth at bedtime.      Multiple Vitamin (MULTIVITAMIN) tablet Take 1 tablet by mouth daily.     omeprazole (PRILOSEC) 20 MG capsule TAKE 1 CAPSULE BY MOUTH EVERY DAY 90 capsule 1   ondansetron (ZOFRAN) 4 MG tablet Take 4 mg by mouth 2 (two) times daily as needed.     paliperidone (INVEGA) 6 MG 24 hr tablet Take 6 mg by mouth at bedtime.     polyethylene glycol powder (MIRALAX) 17 GM/SCOOP powder Take 17 g by mouth daily. 255 g 0   QUEtiapine (SEROQUEL) 25 MG tablet Take 12.5-25 mg by mouth at bedtime.     No current facility-administered medications on file prior  to visit.   Allergies  Allergen Reactions   Shellfish Allergy Anaphylaxis   Social History   Socioeconomic History   Marital status: Married    Spouse name: Not on file   Number of children: 2   Years of education: Not on file   Highest education level: Not on file  Occupational History   Not on file  Tobacco Use   Smoking status: Never   Smokeless tobacco: Never  Vaping Use   Vaping status: Never Used  Substance and Sexual Activity   Alcohol use: No   Drug use: No   Sexual activity: Not on file  Other Topics Concern   Not on file  Social History Narrative   Not on file   Social Drivers of Health   Financial Resource Strain: Not on file  Food Insecurity: Not on file  Transportation Needs: Not on file  Physical Activity: Not on file  Stress: Not on file  Social Connections: Unknown (03/12/2022)   Received from  [facility], [facility]   Social Network    Social Network: Not on file  Intimate Partner Violence: Unknown (02/01/2022)   Received from [facility], [facility]   HITS    Physically Hurt: Not on file    Insult or Talk Down To: Not on file    Threaten Physical Harm: Not on file    Scream or Curse: Not on file   Family History  Problem Relation Age of Onset   Lung cancer Mother    Prostate cancer Father    Hypertension Father    Hyperlipidemia Father    Hypertension Brother    Hyperlipidemia Brother    Colon cancer Neg Hx    Esophageal cancer Neg Hx    Stomach cancer Neg Hx    Rectal cancer Neg Hx        Review of Systems  Neurological:  Positive for dizziness.  All other systems reviewed and are negative.      Objective:   Physical Exam Constitutional:      General: He is not in acute distress.    Appearance: Normal appearance. He is not ill-appearing, toxic-appearing or diaphoretic.  HENT:     Head: Normocephalic and atraumatic.  Cardiovascular:     Rate and Rhythm: Normal rate and regular rhythm.     Pulses: Normal pulses.     Heart sounds: Normal heart sounds. No murmur heard.    No friction rub. No gallop.  Pulmonary:     Effort: Pulmonary effort is normal. No respiratory distress.     Breath sounds: Normal breath sounds. No stridor. No wheezing, rhonchi or rales.  Chest:     Chest wall: No tenderness.  Musculoskeletal:     Cervical back: No muscular tenderness.  Skin:    General: Skin is warm.     Coloration: Skin is not jaundiced or pale.     Findings: No bruising, erythema, lesion or rash.  Neurological:     Mental Status: He is alert.    .     Assessment & Plan:   Elevated LFTs - Plan: CBC with Differential/Platelet, COMPLETE METABOLIC PANEL WITH GFR The blood in his stool has stopped.  The question is whether this came from diverticulosis versus colitis.  At the present time his colonoscopy is up-to-date.  As long as his hemoglobin is  stable, I do not feel any additional treatment necessary.  Agree to continue experience nasal, he may need to follow-up with GI to discuss  treatment for colitis.  My biggest concern for him today is his elevated liver function test.  I believe this is likely either due to medication or fatty liver disease.  We discussed aggressive lifestyle changes including 30 minutes a day of exercise, weight loss, and dietary changes to address this.  I would then repeat liver function test in 3 months.

## 2023-10-25 LAB — COMPLETE METABOLIC PANEL WITH GFR
AG Ratio: 1.4 (calc) (ref 1.0–2.5)
ALT: 97 U/L — ABNORMAL HIGH (ref 9–46)
AST: 66 U/L — ABNORMAL HIGH (ref 10–35)
Albumin: 4.6 g/dL (ref 3.6–5.1)
Alkaline phosphatase (APISO): 274 U/L — ABNORMAL HIGH (ref 35–144)
BUN/Creatinine Ratio: 9 (calc) (ref 6–22)
BUN: 13 mg/dL (ref 7–25)
CO2: 29 mmol/L (ref 20–32)
Calcium: 10.7 mg/dL — ABNORMAL HIGH (ref 8.6–10.3)
Chloride: 104 mmol/L (ref 98–110)
Creat: 1.48 mg/dL — ABNORMAL HIGH (ref 0.70–1.30)
Globulin: 3.2 g/dL (ref 1.9–3.7)
Glucose, Bld: 100 mg/dL — ABNORMAL HIGH (ref 65–99)
Potassium: 4.7 mmol/L (ref 3.5–5.3)
Sodium: 139 mmol/L (ref 135–146)
Total Bilirubin: 0.4 mg/dL (ref 0.2–1.2)
Total Protein: 7.8 g/dL (ref 6.1–8.1)
eGFR: 56 mL/min/{1.73_m2} — ABNORMAL LOW (ref >=60)

## 2023-10-25 LAB — CBC WITH DIFFERENTIAL/PLATELET
Absolute Lymphocytes: 1350 {cells}/uL (ref 850–3900)
Absolute Monocytes: 391 {cells}/uL (ref 200–950)
Basophils Absolute: 39 {cells}/uL (ref 0–200)
Basophils Relative: 0.9 %
Eosinophils Absolute: 90 {cells}/uL (ref 15–500)
Eosinophils Relative: 2.1 %
HCT: 44.9 % (ref 38.5–50.0)
Hemoglobin: 14.8 g/dL (ref 13.2–17.1)
MCH: 28.7 pg (ref 27.0–33.0)
MCHC: 33 g/dL (ref 32.0–36.0)
MCV: 87.2 fL (ref 80.0–100.0)
MPV: 9.3 fL (ref 7.5–12.5)
Monocytes Relative: 9.1 %
Neutro Abs: 2430 {cells}/uL (ref 1500–7800)
Neutrophils Relative %: 56.5 %
Platelets: 423 10*3/uL — ABNORMAL HIGH (ref 140–400)
RBC: 5.15 10*6/uL (ref 4.20–5.80)
RDW: 11.6 % (ref 11.0–15.0)
Total Lymphocyte: 31.4 %
WBC: 4.3 10*3/uL (ref 3.8–10.8)

## 2023-11-04 ENCOUNTER — Other Ambulatory Visit: Payer: Self-pay | Admitting: Family Medicine

## 2023-11-04 DIAGNOSIS — N183 Chronic kidney disease, stage 3 unspecified: Secondary | ICD-10-CM

## 2023-11-18 ENCOUNTER — Ambulatory Visit: Payer: Medicare Other | Admitting: Neurology

## 2023-11-18 DIAGNOSIS — G4733 Obstructive sleep apnea (adult) (pediatric): Secondary | ICD-10-CM

## 2023-11-18 DIAGNOSIS — F19982 Other psychoactive substance use, unspecified with psychoactive substance-induced sleep disorder: Secondary | ICD-10-CM

## 2023-11-18 DIAGNOSIS — R0683 Snoring: Secondary | ICD-10-CM

## 2023-11-18 DIAGNOSIS — R5383 Other fatigue: Secondary | ICD-10-CM

## 2023-11-18 DIAGNOSIS — G4769 Other sleep related movement disorders: Secondary | ICD-10-CM

## 2023-11-18 DIAGNOSIS — Z6832 Body mass index (BMI) 32.0-32.9, adult: Secondary | ICD-10-CM

## 2023-11-19 NOTE — Progress Notes (Signed)
[facility]  Dustin Solis 53 year old male May 28, 1971     HOME SLEEP TEST REPORT ( by Watch PAT)   STUDY DATE:  Data from 11-19-2023    ORDERING CLINICIAN: Melvyn Novas, MD  REFERRING CLINICIAN:   Dr Tanya Nones  CLINICAL INFORMATION/HISTORY: Dustin Solis is a 53 y.o. male patient who is seen upon PCP Dr. Tanya Nones referral on 09/08/2023 for Hypersomnolence. Haematochezia , seen in ED just weeks before HST.    Chief concern according to patient :  ' I am just tired for no reason. I am homebound anyway and take medications , most days I just feel fatigued, not necessarily sleepy. I get 8-10 hours of sleep".   Sleep relevant medical history: One time Nocturia/ anxiety,  sleep choking,  hyperventilating, dream enactment.  Nasal septoplasty . Severe respiratory allergies.   Family medical /sleep history: sister on CPAP with OSA, with atrial fibrillation.   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 and lives in a household with spouse, 2 children.  No pets.  Mother in law lives with them.      Epworth sleepiness score: 10/ 24 points   FSS endorsed at 41/ 63 points.    BMI: 33 kg/m   Neck Circumference: 16"   FINDINGS:   Sleep Summary:   Total Recording Time (hours, min): 10 hours 7 minutes      Total Sleep Time (hours, min):    9 hours 38 minutes             Percent REM (%):    36.6 76.6%                                    Respiratory Indices:   Calculated pAHI (per hour):   AASM criteria applied this AHI is 31.6/h                          REM pAHI:     62.4/h                                            NREM pAHI:     10.1/h                         Positional AHI: The patient slept mostly on his left side for 282 minutes with an AHI of 36/h versus 207 minutes in supine with an AHI of 34.4/h.  Prone sleep was seen for 89 minutes with an AHI of 5.4/h.  Snoring statistics show a mean volume of 43 dB and about 40% of the  total sleep time was associated with snoring activity.                                                 Oxygen Saturation Statistics:   Oxygen Saturation (%) Mean:     93%         O2 Saturation Range (%): Between a nadir at 74% and a maximum saturation of 99%  O2 Saturation (minutes) <89%:   0.2 minutes        Pulse Rate Statistics:   Pulse Mean (bpm):    77 bpm             Pulse Range:    Between 59 and 103 bpm             IMPRESSION:  This HST confirms the presence of severe and apparently all obstructive sleep apnea associated with a low nadir REM sleep dependent oxygen desaturation. This apnea was strongly REM sleep dependent and less so positional dependent.    RECOMMENDATION: I strongly recommend auto titration CPAP for REM sleep dependent sleep apnea of this degree and associated with hypoxemia.  These 2 factors also make this form of apnea not a good form to be treated with a dental device or with an implanted hypoglossal nerve stimulator device.  If [name] is apprehensive about positive airway pressure, I could offer him an in-lab titration.  He would have to bring his sleep aids and other medications with him as he has trouble initiating sleep related to his other medical conditions.  I would however like to go ahead and order an auto titration CPAP device.  This allows almost immediate treatment but depends on the [employer] of the medical equipment company to fit the patient with a mask that is not additionally anxiety inducing and to spend time with him introducing him to the technology.    INTERPRETING PHYSICIAN:   Melvyn Novas, MD  [facility] Sleep Board certified by The [employer] of Sleep Medicine and Diplomate of the [employer] of Sleep Medicine. Board certified In Neurology through the ABPN, Fellow of the [employer] of Neurology.

## 2023-12-01 ENCOUNTER — Encounter: Payer: Self-pay | Admitting: Neurology

## 2023-12-01 NOTE — Addendum Note (Signed)
Addended by: Melvyn Novas on: 12/01/2023 03:13 PM   Modules accepted: Orders

## 2023-12-01 NOTE — Procedures (Signed)
[facility]  Dustin Solis 53 year old male May 28, 1971     HOME SLEEP TEST REPORT ( by Watch PAT)   STUDY DATE:  Data from 11-19-2023    ORDERING CLINICIAN: Melvyn Novas, MD  REFERRING CLINICIAN:   Dr Tanya Nones  CLINICAL INFORMATION/HISTORY: Dustin Solis is a 53 y.o. male patient who is seen upon PCP Dr. Tanya Nones referral on 09/08/2023 for Hypersomnolence. Haematochezia , seen in ED just weeks before HST.    Chief concern according to patient :  ' I am just tired for no reason. I am homebound anyway and take medications , most days I just feel fatigued, not necessarily sleepy. I get 8-10 hours of sleep".   Sleep relevant medical history: One time Nocturia/ anxiety,  sleep choking,  hyperventilating, dream enactment.  Nasal septoplasty . Severe respiratory allergies.   Family medical /sleep history: sister on CPAP with OSA, with atrial fibrillation.   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 and lives in a household with spouse, 2 children.  No pets.  Mother in law lives with them.      Epworth sleepiness score: 10/ 24 points   FSS endorsed at 41/ 63 points.    BMI: 33 kg/m   Neck Circumference: 16"   FINDINGS:   Sleep Summary:   Total Recording Time (hours, min): 10 hours 7 minutes      Total Sleep Time (hours, min):    9 hours 38 minutes             Percent REM (%):    36.6 76.6%                                    Respiratory Indices:   Calculated pAHI (per hour):   AASM criteria applied this AHI is 31.6/h                          REM pAHI:     62.4/h                                            NREM pAHI:     10.1/h                         Positional AHI: The patient slept mostly on his left side for 282 minutes with an AHI of 36/h versus 207 minutes in supine with an AHI of 34.4/h.  Prone sleep was seen for 89 minutes with an AHI of 5.4/h.  Snoring statistics show a mean volume of 43 dB and about 40% of the  total sleep time was associated with snoring activity.                                                 Oxygen Saturation Statistics:   Oxygen Saturation (%) Mean:     93%         O2 Saturation Range (%): Between a nadir at 74% and a maximum saturation of 99%  O2 Saturation (minutes) <89%:   0.2 minutes        Pulse Rate Statistics:   Pulse Mean (bpm):    77 bpm             Pulse Range:    Between 59 and 103 bpm             IMPRESSION:  This HST confirms the presence of severe and apparently all obstructive sleep apnea associated with a low nadir REM sleep dependent oxygen desaturation. This apnea was strongly REM sleep dependent and less so positional dependent.    RECOMMENDATION: I strongly recommend auto titration CPAP for REM sleep dependent sleep apnea of this degree and associated with hypoxemia.  These 2 factors also make this form of apnea not a good form to be treated with a dental device or with an implanted hypoglossal nerve stimulator device.  If [name] is apprehensive about positive airway pressure, I could offer him an in-lab titration.  He would have to bring his sleep aids and other medications with him as he has trouble initiating sleep related to his other medical conditions.  I would however like to go ahead and order an auto titration CPAP device.  This allows almost immediate treatment but depends on the [employer] of the medical equipment company to fit the patient with a mask that is not additionally anxiety inducing and to spend time with him introducing him to the technology.    INTERPRETING PHYSICIAN:   Melvyn Novas, MD  [facility] Sleep Board certified by The [employer] of Sleep Medicine and Diplomate of the [employer] of Sleep Medicine. Board certified In Neurology through the ABPN, Fellow of the [employer] of Neurology.

## 2023-12-02 ENCOUNTER — Telehealth: Payer: Self-pay | Admitting: *Deleted

## 2023-12-02 NOTE — Telephone Encounter (Signed)
-----   Message from Tutuilla Dohmeier sent at 12/01/2023  3:13 PM EST ----- Severe apnea and strongly REM sleep dependent apnea as well.  The best efficacy is offered by PAP treatment, and I like to order an autotitration device  for this gentlemen. If he is very apprehensive, we can  try to arrange for in lab titration.  If he is OK to start treatment: this is the order;  auto CPAP ResMed  ramp, pressure window 7 through 18 cm water, 2 cm EPR.

## 2023-12-02 NOTE — Telephone Encounter (Signed)
LVM for pt to call back to review sleep results

## 2023-12-10 ENCOUNTER — Telehealth: Payer: Self-pay

## 2023-12-10 NOTE — Telephone Encounter (Signed)
Copied from CRM [id number]. Topic: Medical Record Request - Other >> Dec 10, 2023  4:22 PM Geroge Baseman wrote: Reason for CRM: Patients wife called to see if his latest lab results can be faxed over to his psychiatrist. Fax number is [phone] attention Verlon Au at [facility]. They said they were never received from first request.

## 2023-12-11 NOTE — Telephone Encounter (Signed)
Efax recent lab results to [place] [facility] to [phone]

## 2023-12-17 NOTE — Telephone Encounter (Signed)
 LVM for pt to call back to discuss sleep study results

## 2023-12-18 NOTE — Telephone Encounter (Addendum)
 Dustin Solis, CMA  Dustin Solis, Dustin Solis; Dustin Solis, Dustin Solis New orders have been placed for the above pt, DOB: [birth year] Thanks  New, [name], [name], CMA; [name]; [name]; 1 other Received, thank you!

## 2024-01-19 DIAGNOSIS — J309 Allergic rhinitis, unspecified: Secondary | ICD-10-CM | POA: Diagnosis not present

## 2024-01-20 ENCOUNTER — Ambulatory Visit: Admitting: Family Medicine

## 2024-02-16 ENCOUNTER — Other Ambulatory Visit: Payer: Self-pay | Admitting: Family Medicine

## 2024-02-16 DIAGNOSIS — N183 Chronic kidney disease, stage 3 unspecified: Secondary | ICD-10-CM

## 2024-02-17 NOTE — Telephone Encounter (Signed)
 Requested Prescriptions  Pending Prescriptions Disp Refills   amLODipine  (NORVASC ) 10 MG tablet [Pharmacy Med Name: AMLODIPINE  BESYLATE 10MG  TABLETS] 90 tablet 0    Sig: TAKE 1 TABLET(10 MG) BY MOUTH DAILY     Cardiovascular: Calcium Channel Blockers 2 Failed - 02/17/2024  1:25 PM      Failed - Valid encounter within last 6 months    Recent Outpatient Visits           3 months ago Elevated LFTs   [place] [facility] Pickard, Cisco Crest, MD   8 months ago Hypersomnolence   [place] [facility] Pickard, Cisco Crest, MD   1 year ago Environmental allergies   [place] [facility] Cheril Cork, Cisco Crest, MD   1 year ago Fatigue, unspecified type   [place] [facility] Austine Lefort, MD              Passed - Last BP in normal range    BP Readings from Last 1 Encounters:  10/24/23 126/86         Passed - Last Heart Rate in normal range    Pulse Readings from Last 1 Encounters:  10/24/23 96

## 2024-03-23 ENCOUNTER — Other Ambulatory Visit: Payer: Self-pay | Admitting: Family Medicine

## 2024-03-23 DIAGNOSIS — R110 Nausea: Secondary | ICD-10-CM

## 2024-03-23 DIAGNOSIS — R140 Abdominal distension (gaseous): Secondary | ICD-10-CM

## 2024-05-03 ENCOUNTER — Other Ambulatory Visit: Payer: Self-pay | Admitting: Family Medicine

## 2024-05-03 DIAGNOSIS — N183 Chronic kidney disease, stage 3 unspecified: Secondary | ICD-10-CM

## 2024-05-03 DIAGNOSIS — J320 Chronic maxillary sinusitis: Secondary | ICD-10-CM

## 2024-05-03 NOTE — Telephone Encounter (Unsigned)
 Copied from CRM [id number]. Topic: Clinical - Medication Refill >> May 03, 2024 10:16 AM Donee H wrote: Medication:amLODIPine  Besylate 10 MG   Has the patient contacted their pharmacy? Yes (Agent: If no, request that the patient contact the pharmacy for the refill. If patient does not wish to contact the pharmacy document the reason why and proceed with request.) (Agent: If yes, when and what did the pharmacy advise?)  This is the patient's preferred pharmacy:  [facility] [phone] - [place], [place] - [phone] AT [facility] OF [address] [place] [address] Phone: [phone] Fax: [phone]  Is this the correct pharmacy for this prescription? Yes If no, delete pharmacy and type the correct one.   Has the prescription been filled recently? No  Is the patient out of the medication? Yes, a  Has the patient been seen for an appointment in the last year OR does the patient have an upcoming appointment? Yes  Can we respond through MyChart? Yes  Agent: Please be advised that Rx refills may take up to 3 business days. We ask that you follow-up with your pharmacy.

## 2024-05-05 NOTE — Telephone Encounter (Signed)
 Refilled 05/03/24 # 90. Requested Prescriptions  Refused Prescriptions Disp Refills   amLODipine  (NORVASC ) 10 MG tablet 90 tablet 0     Cardiovascular: Calcium Channel Blockers 2 Failed - 05/05/2024 11:44 AM      Failed - Valid encounter within last 6 months    Recent Outpatient Visits           6 months ago Elevated LFTs   [place] [facility] Pickard, Butler DASEN, MD   11 months ago Hypersomnolence   [place] [facility] Pickard, Butler DASEN, MD   1 year ago Environmental allergies   [place] [facility] Duanne Butler DASEN, MD   1 year ago Fatigue, unspecified type   [place] [facility] Duanne Butler DASEN, MD              Passed - Last BP in normal range    BP Readings from Last 1 Encounters:  10/24/23 126/86         Passed - Last Heart Rate in normal range    Pulse Readings from Last 1 Encounters:  10/24/23 96

## 2024-06-04 DIAGNOSIS — H401123 Primary open-angle glaucoma, left eye, severe stage: Secondary | ICD-10-CM | POA: Diagnosis not present

## 2024-06-04 DIAGNOSIS — H524 Presbyopia: Secondary | ICD-10-CM | POA: Diagnosis not present

## 2024-06-04 DIAGNOSIS — H35033 Hypertensive retinopathy, bilateral: Secondary | ICD-10-CM | POA: Diagnosis not present

## 2024-06-30 NOTE — Telephone Encounter (Signed)
 Received orders needed to be signed and sent to synapse for the pt. Will sign and get sent in

## 2024-07-06 ENCOUNTER — Telehealth: Payer: Self-pay | Admitting: Gastroenterology

## 2024-07-06 NOTE — Telephone Encounter (Signed)
 Inbound call from patients wife Larry stating she needed to make an appointment for patient. Patient was scheduled  for 08/18/2024 at 10: 20 with Delon Failing. Patients wife is requesting a call to discuss symptoms. Please advise.

## 2024-07-06 NOTE — Telephone Encounter (Signed)
 Left message on machine to call back

## 2024-07-07 NOTE — Telephone Encounter (Signed)
 Left message on machine to call back

## 2024-07-08 NOTE — Telephone Encounter (Signed)
 Attempted to reach pt x3 with no response will await further communication from the pt

## 2024-07-20 ENCOUNTER — Other Ambulatory Visit: Payer: Self-pay | Admitting: Family Medicine

## 2024-07-20 DIAGNOSIS — J320 Chronic maxillary sinusitis: Secondary | ICD-10-CM

## 2024-08-12 ENCOUNTER — Encounter: Payer: Self-pay | Admitting: Family Medicine

## 2024-08-12 ENCOUNTER — Ambulatory Visit: Admitting: Family Medicine

## 2024-08-12 VITALS — BP 118/80 | HR 83 | Temp 98.5°F | Ht 67.0 in | Wt 214.2 lb

## 2024-08-12 DIAGNOSIS — N1831 Chronic kidney disease, stage 3a: Secondary | ICD-10-CM | POA: Diagnosis not present

## 2024-08-12 DIAGNOSIS — Z0001 Encounter for general adult medical examination with abnormal findings: Secondary | ICD-10-CM | POA: Diagnosis not present

## 2024-08-12 DIAGNOSIS — Z125 Encounter for screening for malignant neoplasm of prostate: Secondary | ICD-10-CM

## 2024-08-12 DIAGNOSIS — Z23 Encounter for immunization: Secondary | ICD-10-CM

## 2024-08-12 DIAGNOSIS — Z0000 Encounter for general adult medical examination without abnormal findings: Secondary | ICD-10-CM

## 2024-08-12 MED ORDER — LOSARTAN POTASSIUM 100 MG PO TABS: 100.0000 mg | ORAL_TABLET | Freq: Every day | ORAL | 3 refills | Status: AC

## 2024-08-12 NOTE — Progress Notes (Signed)
 Subjective:    Patient ID: Dustin Solis, male    DOB: 1971/09/26, 53 y.o.   MRN: [id number]  HPI Patient is a very pleasant 53 year old African-American male here today for complete physical exam.  Had colonoscopy 5/24.  Patient is due for prostate cancer screening.  He would like to check a PSA.  He is also due for flu shot, shingles vaccine, and a pneumonia vaccine.  He declines the shingles and pneumonia vaccine but he would like to receive the flu shot today.  Otherwise he is doing well.  His blood pressure is outstanding at 118/80.  He recently injured his left shoulder while exercising.  He was performing tricep extension exercises when he lost his balance and fell landing on his left shoulder.  Today he has mild pain with empty can testing.  He has mild pain with Hawking's maneuver.  He has no pain with internal or external rotation.  He has no pain with passive range of motion of the shoulder.  I believe the patient likely strained his rotator cuff however I do not see any evidence of a tear on his exam Past Medical History:  Diagnosis Date   Allergy    Anxiety    Chronic kidney disease    Depression    Hypertension    Past Surgical History:  Procedure Laterality Date   NASAL SEPTUM SURGERY  1999   Current Outpatient Medications on File Prior to Visit  Medication Sig Dispense Refill   acetaminophen  (TYLENOL ) 500 MG tablet Take 2 tablets (1,000 mg total) by mouth every 6 (six) hours as needed. 30 tablet 0   amLODipine  (NORVASC ) 10 MG tablet TAKE 1 TABLET(10 MG) BY MOUTH DAILY 90 tablet 0   Azelastine  HCl 137 MCG/SPRAY SOLN PLACE 1 SPRAY INTO BOTH NOSTRILS 2 (TWO) TIMES DAILY. USE IN EACH NOSTRIL AS DIRECTED 90 mL 0   cetirizine (ZYRTEC) 10 MG tablet Take 10 mg by mouth daily.     cyclobenzaprine  (FLEXERIL ) 10 MG tablet Take 1 tablet (10 mg total) by mouth 3 (three) times daily as needed for muscle spasms. 30 tablet 2   EPIPEN 2-PAK 0.3 MG/0.3ML SOAJ injection Inject 0.3 mg into  the muscle once.      fluticasone  (FLONASE ) 50 MCG/ACT nasal spray INHALE 2 SPRAYS IN BOTH NOSTILS EVERY DAY 16 g 1   hyoscyamine  (LEVSIN /SL) 0.125 MG SL tablet Place 1 tablet (0.125 mg total) under the tongue every 6 (six) hours as needed for up to 5 days. 30 tablet 0   lamoTRIgine (LAMICTAL) 150 MG tablet Take 300 mg by mouth daily.      meclizine  (ANTIVERT ) 25 MG tablet Take 1 tablet (25 mg total) by mouth 3 (three) times daily as needed for dizziness. 30 tablet 0   mesalamine  (LIALDA ) 1.2 g EC tablet Take 2 tablets (2.4 g total) by mouth daily with breakfast. (Patient not taking: Reported on 10/24/2023) 60 tablet 1   montelukast (SINGULAIR) 10 MG tablet Take 10 mg by mouth at bedtime.      Multiple Vitamin (MULTIVITAMIN) tablet Take 1 tablet by mouth daily.     omeprazole  (PRILOSEC) 20 MG capsule TAKE 1 CAPSULE BY MOUTH EVERY DAY 90 capsule 1   ondansetron  (ZOFRAN ) 4 MG tablet Take 4 mg by mouth 2 (two) times daily as needed.     ondansetron  (ZOFRAN ) 4 MG tablet Take 1 tablet (4 mg total) by mouth every 8 (eight) hours as needed for nausea or vomiting. 20 tablet 0  paliperidone (INVEGA) 6 MG 24 hr tablet Take 6 mg by mouth at bedtime.     polyethylene glycol powder (MIRALAX ) 17 GM/SCOOP powder Take 17 g by mouth daily. 255 g 0   QUEtiapine (SEROQUEL) 25 MG tablet Take 12.5-25 mg by mouth at bedtime.     No current facility-administered medications on file prior to visit.   Allergies  Allergen Reactions   Shellfish Allergy Anaphylaxis   Social History   Socioeconomic History   Marital status: Married    Spouse name: Not on file   Number of children: 2   Years of education: Not on file   Highest education level: Not on file  Occupational History   Not on file  Tobacco Use   Smoking status: Never   Smokeless tobacco: Never  Vaping Use   Vaping status: Never Used  Substance and Sexual Activity   Alcohol use: No   Drug use: No   Sexual activity: Not on file  Other Topics  Concern   Not on file  Social History Narrative   Not on file   Social Drivers of Health   Financial Resource Strain: Not on file  Food Insecurity: Not on file  Transportation Needs: Not on file  Physical Activity: Not on file  Stress: Not on file  Social Connections: Unknown (03/12/2022)   Received from [facility]   Social Network    Social Network: Not on file  Intimate Partner Violence: Unknown (02/01/2022)   Received from [facility]   HITS    Physically Hurt: Not on file    Insult or Talk Down To: Not on file    Threaten Physical Harm: Not on file    Scream or Curse: Not on file   Family History  Problem Relation Age of Onset   Lung cancer Mother    Prostate cancer Father    Hypertension Father    Hyperlipidemia Father    Hypertension Brother    Hyperlipidemia Brother    Colon cancer Neg Hx    Esophageal cancer Neg Hx    Stomach cancer Neg Hx    Rectal cancer Neg Hx        Review of Systems  All other systems reviewed and are negative.      Objective:   Physical Exam Constitutional:      General: He is not in acute distress.    Appearance: Normal appearance. He is not ill-appearing, toxic-appearing or diaphoretic.  HENT:     Head: Normocephalic and atraumatic.     Right Ear: Tympanic membrane, ear canal and external ear normal. There is no impacted cerumen.     Left Ear: Tympanic membrane, ear canal and external ear normal. There is no impacted cerumen.     Nose: Nose normal. No congestion or rhinorrhea.     Mouth/Throat:     Mouth: Mucous membranes are moist.     Pharynx: No oropharyngeal exudate or posterior oropharyngeal erythema.  Eyes:     General: No scleral icterus.       Right eye: No discharge.        Left eye: No discharge.     Extraocular Movements: Extraocular movements intact.     Conjunctiva/sclera: Conjunctivae normal.     Pupils: Pupils are equal, round, and reactive to light.  Neck:     Vascular: No carotid bruit.   Cardiovascular:     Rate and Rhythm: Normal rate and regular rhythm.     Pulses: Normal pulses.  Heart sounds: Normal heart sounds. No murmur heard.    No friction rub. No gallop.  Pulmonary:     Effort: Pulmonary effort is normal. No respiratory distress.     Breath sounds: Normal breath sounds. No stridor. No wheezing, rhonchi or rales.  Chest:     Chest wall: No tenderness.  Abdominal:     General: Abdomen is flat. Bowel sounds are normal. There is no distension.     Palpations: Abdomen is soft. There is no mass.     Tenderness: There is no abdominal tenderness. There is no right CVA tenderness, left CVA tenderness, guarding or rebound.     Hernia: No hernia is present.  Musculoskeletal:        General: No swelling, tenderness or deformity.     Cervical back: Normal range of motion and neck supple. No rigidity. No muscular tenderness.     Right lower leg: No edema.     Left lower leg: No edema.  Lymphadenopathy:     Cervical: No cervical adenopathy.  Skin:    General: Skin is warm.     Coloration: Skin is not jaundiced or pale.     Findings: No bruising, erythema, lesion or rash.  Neurological:     General: No focal deficit present.     Mental Status: He is alert and oriented to person, place, and time. Mental status is at baseline.     Cranial Nerves: No cranial nerve deficit.     Sensory: No sensory deficit.     Motor: No weakness.     Coordination: Coordination normal.     Gait: Gait normal.     Deep Tendon Reflexes: Reflexes normal.  Psychiatric:        Mood and Affect: Mood normal.        Behavior: Behavior normal.        Thought Content: Thought content normal.        Judgment: Judgment normal.    Patient has some mild gynecomastia and a small umbilical hernia       Assessment & Plan:  Stage 3a chronic kidney disease (HCC) - Plan: CBC with Differential/Platelet, Comprehensive metabolic panel with GFR, Lipid panel, PSA, Protein / Creatinine Ratio, Urine,  losartan (COZAAR) 100 MG tablet  General medical exam - Plan: CBC with Differential/Platelet, Comprehensive metabolic panel with GFR, Lipid panel, PSA, Protein / Creatinine Ratio, Urine  Prostate cancer screening - Plan: CBC with Differential/Platelet, Comprehensive metabolic panel with GFR, Lipid panel, PSA, Protein / Creatinine Ratio, Urine  Immunization due - Plan: Flu vaccine trivalent PF, 6mos and older(Flulaval,Afluria,Fluarix,Fluzone) Blood pressure is outstanding.  However given his history of chronic kidney disease, I do feel that the patient will be better served on an angiotensin receptor blocker compared to amlodipine .  I recommended that he stop amlodipine  and replace it with losartan 100 mg a day.  We will check a CBC, CMP, lipid panel, PSA, and a urine protein creatinine ratio.  Colonoscopy is up-to-date.  Patient received his flu shot.  He declines shingles vaccine and the pneumonia vaccine.

## 2024-08-13 ENCOUNTER — Ambulatory Visit: Payer: Self-pay | Admitting: Family Medicine

## 2024-08-13 LAB — COMPREHENSIVE METABOLIC PANEL WITH GFR
AG Ratio: 1.5 (calc) (ref 1.0–2.5)
ALT: 49 U/L — ABNORMAL HIGH (ref 9–46)
AST: 35 U/L (ref 10–35)
Albumin: 4.6 g/dL (ref 3.6–5.1)
Alkaline phosphatase (APISO): 184 U/L — ABNORMAL HIGH (ref 35–144)
BUN/Creatinine Ratio: 10 (calc) (ref 6–22)
BUN: 16 mg/dL (ref 7–25)
CO2: 24 mmol/L (ref 20–32)
Calcium: 10.3 mg/dL (ref 8.6–10.3)
Chloride: 102 mmol/L (ref 98–110)
Creat: 1.53 mg/dL — ABNORMAL HIGH (ref 0.70–1.30)
Globulin: 3 g/dL (ref 1.9–3.7)
Glucose, Bld: 93 mg/dL (ref 65–99)
Potassium: 4.2 mmol/L (ref 3.5–5.3)
Sodium: 137 mmol/L (ref 135–146)
Total Bilirubin: 0.4 mg/dL (ref 0.2–1.2)
Total Protein: 7.6 g/dL (ref 6.1–8.1)
eGFR: 54 mL/min/1.73m2 — ABNORMAL LOW (ref >=60)

## 2024-08-13 LAB — LIPID PANEL
Cholesterol: 157 mg/dL (ref <200)
HDL: 56 mg/dL (ref >=40)
LDL Cholesterol (Calc): 75 mg/dL
Non-HDL Cholesterol (Calc): 101 mg/dL (ref <130)
Total CHOL/HDL Ratio: 2.8 (calc) (ref <5.0)
Triglycerides: 166 mg/dL — ABNORMAL HIGH (ref <150)

## 2024-08-13 LAB — CBC WITH DIFFERENTIAL/PLATELET
Absolute Lymphocytes: 1891 {cells}/uL (ref 850–3900)
Absolute Monocytes: 428 {cells}/uL (ref 200–950)
Basophils Absolute: 32 {cells}/uL (ref 0–200)
Basophils Relative: 0.7 %
Eosinophils Absolute: 78 {cells}/uL (ref 15–500)
Eosinophils Relative: 1.7 %
HCT: 43.6 % (ref 38.5–50.0)
Hemoglobin: 14.6 g/dL (ref 13.2–17.1)
MCH: 29.2 pg (ref 27.0–33.0)
MCHC: 33.5 g/dL (ref 32.0–36.0)
MCV: 87.2 fL (ref 80.0–100.0)
MPV: 9.1 fL (ref 7.5–12.5)
Monocytes Relative: 9.3 %
Neutro Abs: 2171 {cells}/uL (ref 1500–7800)
Neutrophils Relative %: 47.2 %
Platelets: 403 Thousand/uL — ABNORMAL HIGH (ref 140–400)
RBC: 5 Million/uL (ref 4.20–5.80)
RDW: 12.1 % (ref 11.0–15.0)
Total Lymphocyte: 41.1 %
WBC: 4.6 Thousand/uL (ref 3.8–10.8)

## 2024-08-13 LAB — PROTEIN / CREATININE RATIO, URINE
Creatinine, Urine: 171 mg/dL (ref 20–320)
Protein/Creat Ratio: 41 mg/g{creat} (ref 25–148)
Protein/Creatinine Ratio: 0.041 mg/mg{creat} (ref 0.025–0.148)
Total Protein, Urine: 7 mg/dL (ref 5–25)

## 2024-08-13 LAB — PSA: PSA: 0.34 ng/mL (ref <=4.00)

## 2024-08-18 ENCOUNTER — Ambulatory Visit: Admitting: Physician Assistant

## 2024-08-27 ENCOUNTER — Encounter: Admitting: Family Medicine

## 2024-09-19 ENCOUNTER — Other Ambulatory Visit: Payer: Self-pay | Admitting: Family Medicine

## 2024-09-19 DIAGNOSIS — J320 Chronic maxillary sinusitis: Secondary | ICD-10-CM

## 2024-09-20 ENCOUNTER — Other Ambulatory Visit: Payer: Self-pay

## 2024-09-20 ENCOUNTER — Telehealth: Payer: Self-pay

## 2024-09-20 NOTE — Telephone Encounter (Signed)
 Prescription Request  09/20/2024  LOV: 08/12/24  What is the name of the medication or equipment? fluticasone  (FLONASE ) 50 MCG/ACT nasal spray [[id number]]   Have you contacted your pharmacy to request a refill? Yes   Which pharmacy would you like this sent to?  [facility] [phone] - [place], [place] - [address] AT [facility] [address] [place] [address] Phone: [phone] Fax: [phone]    Patient notified that their request is being sent to the clinical staff for review and that they should receive a response within 2 business days.   Please advise at [facility] [phone]

## 2024-09-20 NOTE — Telephone Encounter (Signed)
 Sent in medication

## 2024-09-21 ENCOUNTER — Encounter: Payer: Self-pay | Admitting: Family Medicine

## 2024-09-21 ENCOUNTER — Other Ambulatory Visit: Payer: Self-pay

## 2024-09-21 DIAGNOSIS — R110 Nausea: Secondary | ICD-10-CM

## 2024-09-21 DIAGNOSIS — R42 Dizziness and giddiness: Secondary | ICD-10-CM

## 2024-09-21 MED ORDER — MECLIZINE HCL 25 MG PO TABS
25.0000 mg | ORAL_TABLET | Freq: Three times a day (TID) | ORAL | 0 refills | Status: AC | PRN
Start: 2024-09-21 — End: ?

## 2024-09-21 MED ORDER — ONDANSETRON HCL 4 MG PO TABS: 4.0000 mg | ORAL_TABLET | Freq: Three times a day (TID) | ORAL | 0 refills | Status: AC | PRN

## 2024-09-29 ENCOUNTER — Other Ambulatory Visit: Payer: Self-pay | Admitting: Family Medicine

## 2024-09-29 DIAGNOSIS — R110 Nausea: Secondary | ICD-10-CM

## 2024-09-29 DIAGNOSIS — R140 Abdominal distension (gaseous): Secondary | ICD-10-CM

## 2024-10-02 NOTE — Telephone Encounter (Signed)
 LOV 08/21/24.Within protocol.  Requested Prescriptions  Pending Prescriptions Disp Refills   omeprazole  (PRILOSEC) 20 MG capsule [Pharmacy Med Name: OMEPRAZOLE  20MG  CAPSULES] 90 capsule 0    Sig: TAKE 1 CAPSULE BY MOUTH EVERY DAY     Gastroenterology: Proton Pump Inhibitors Failed - 10/02/2024  8:28 AM      Failed - Valid encounter within last 12 months    Recent Outpatient Visits           1 month ago Stage 3a chronic kidney disease (HCC)   [place] [facility] Duanne Butler DASEN, MD   11 months ago Elevated LFTs   [place] [facility] Duanne, Butler DASEN, MD   1 year ago Hypersomnolence   [place] [facility] Duanne Butler DASEN, MD   1 year ago Environmental allergies   [place] [facility] Duanne Butler DASEN, MD   1 year ago Fatigue, unspecified type   [facility] Pickard, Butler DASEN, MD

## 2024-12-03 ENCOUNTER — Telehealth: Payer: Self-pay

## 2024-12-03 NOTE — Telephone Encounter (Signed)
 Copied from CRM [phone]. Topic: Appointments - Appointment Scheduling >> Dec 02, 2024  1:51 PM Rosaria BRAVO wrote: Pt states that he received a voicemail requesting him to contact the office and schedule an appt. Pt wants to know why? He says he does not believe he needs an appt   Best contact: [phone]  Please call back and confirm if appt is needed.
